# Patient Record
Sex: Female | Born: 1960 | Race: White | Hispanic: No | Marital: Married | Smoking: Never smoker
Health system: Southern US, Community
[De-identification: ages and names within clinical notes are randomized; demographics above are authoritative.]

## PROBLEM LIST (undated history)

## (undated) DIAGNOSIS — I8290 Acute embolism and thrombosis of unspecified vein: Secondary | ICD-10-CM

## (undated) DIAGNOSIS — I10 Essential (primary) hypertension: Secondary | ICD-10-CM

## (undated) DIAGNOSIS — E7800 Pure hypercholesterolemia, unspecified: Secondary | ICD-10-CM

## (undated) DIAGNOSIS — M810 Age-related osteoporosis without current pathological fracture: Secondary | ICD-10-CM

## (undated) HISTORY — PX: OTHER SURGICAL HISTORY: SHX169

## (undated) HISTORY — DX: Age-related osteoporosis without current pathological fracture: M81.0

## (undated) HISTORY — DX: Pure hypercholesterolemia, unspecified: E78.00

## (undated) HISTORY — DX: Essential (primary) hypertension: I10

## (undated) HISTORY — DX: Acute embolism and thrombosis of unspecified vein: I82.90

---

## 1986-01-01 HISTORY — PX: LAPAROSCOPY: SHX197

## 1998-04-19 ENCOUNTER — Other Ambulatory Visit: Admission: RE | Admit: 1998-04-19 | Discharge: 1998-04-19 | Payer: Self-pay | Admitting: Gynecology

## 1999-06-23 ENCOUNTER — Other Ambulatory Visit: Admission: RE | Admit: 1999-06-23 | Discharge: 1999-06-23 | Payer: Self-pay | Admitting: Gynecology

## 2000-10-15 ENCOUNTER — Other Ambulatory Visit: Admission: RE | Admit: 2000-10-15 | Discharge: 2000-10-15 | Payer: Self-pay | Admitting: Gynecology

## 2002-01-06 ENCOUNTER — Other Ambulatory Visit: Admission: RE | Admit: 2002-01-06 | Discharge: 2002-01-06 | Payer: Self-pay | Admitting: Gynecology

## 2003-01-11 ENCOUNTER — Other Ambulatory Visit: Admission: RE | Admit: 2003-01-11 | Discharge: 2003-01-11 | Payer: Self-pay | Admitting: Gynecology

## 2003-07-11 ENCOUNTER — Emergency Department (HOSPITAL_COMMUNITY): Admission: EM | Admit: 2003-07-11 | Discharge: 2003-07-11 | Payer: Self-pay

## 2004-01-13 ENCOUNTER — Other Ambulatory Visit: Admission: RE | Admit: 2004-01-13 | Discharge: 2004-01-13 | Payer: Self-pay | Admitting: Gynecology

## 2005-01-16 ENCOUNTER — Other Ambulatory Visit: Admission: RE | Admit: 2005-01-16 | Discharge: 2005-01-16 | Payer: Self-pay | Admitting: Gynecology

## 2006-01-17 ENCOUNTER — Other Ambulatory Visit: Admission: RE | Admit: 2006-01-17 | Discharge: 2006-01-17 | Payer: Self-pay | Admitting: Gynecology

## 2007-01-22 ENCOUNTER — Other Ambulatory Visit: Admission: RE | Admit: 2007-01-22 | Discharge: 2007-01-22 | Payer: Self-pay | Admitting: Gynecology

## 2007-10-10 ENCOUNTER — Encounter: Admission: RE | Admit: 2007-10-10 | Discharge: 2007-10-10 | Payer: Self-pay | Admitting: Cardiology

## 2007-10-16 ENCOUNTER — Ambulatory Visit: Payer: Self-pay | Admitting: Gynecology

## 2007-12-09 ENCOUNTER — Ambulatory Visit: Payer: Self-pay | Admitting: Gynecology

## 2008-01-27 ENCOUNTER — Ambulatory Visit: Payer: Self-pay | Admitting: Gynecology

## 2008-01-27 ENCOUNTER — Encounter: Payer: Self-pay | Admitting: Gynecology

## 2008-01-27 ENCOUNTER — Other Ambulatory Visit: Admission: RE | Admit: 2008-01-27 | Discharge: 2008-01-27 | Payer: Self-pay | Admitting: Gynecology

## 2008-10-13 ENCOUNTER — Ambulatory Visit: Payer: Self-pay | Admitting: Gynecology

## 2009-07-01 ENCOUNTER — Encounter: Admission: RE | Admit: 2009-07-01 | Discharge: 2009-07-01 | Payer: Self-pay | Admitting: Family Medicine

## 2010-02-21 ENCOUNTER — Encounter: Payer: Self-pay | Admitting: Gynecology

## 2010-02-24 ENCOUNTER — Encounter: Payer: Self-pay | Admitting: Gynecology

## 2010-02-28 ENCOUNTER — Other Ambulatory Visit: Payer: Self-pay | Admitting: Gynecology

## 2010-02-28 ENCOUNTER — Other Ambulatory Visit (HOSPITAL_COMMUNITY)
Admission: RE | Admit: 2010-02-28 | Discharge: 2010-02-28 | Disposition: A | Discharge status: Home / Self Care | Payer: BC Managed Care – PPO | Source: Ambulatory Visit | Attending: Gynecology | Admitting: Gynecology

## 2010-02-28 ENCOUNTER — Encounter (INDEPENDENT_AMBULATORY_CARE_PROVIDER_SITE_OTHER): Payer: BC Managed Care – PPO | Admitting: Gynecology

## 2010-02-28 DIAGNOSIS — Z13220 Encounter for screening for lipoid disorders: Secondary | ICD-10-CM

## 2010-02-28 DIAGNOSIS — Z833 Family history of diabetes mellitus: Secondary | ICD-10-CM

## 2010-02-28 DIAGNOSIS — Z01419 Encounter for gynecological examination (general) (routine) without abnormal findings: Secondary | ICD-10-CM

## 2010-02-28 DIAGNOSIS — R82998 Other abnormal findings in urine: Secondary | ICD-10-CM

## 2010-02-28 DIAGNOSIS — B373 Candidiasis of vulva and vagina: Secondary | ICD-10-CM

## 2010-02-28 DIAGNOSIS — Z124 Encounter for screening for malignant neoplasm of cervix: Secondary | ICD-10-CM | POA: Insufficient documentation

## 2010-04-02 ENCOUNTER — Other Ambulatory Visit: Payer: Self-pay | Admitting: Cardiology

## 2010-04-02 DIAGNOSIS — I10 Essential (primary) hypertension: Secondary | ICD-10-CM

## 2010-05-30 ENCOUNTER — Encounter: Payer: Self-pay | Admitting: Cardiology

## 2010-07-08 ENCOUNTER — Other Ambulatory Visit: Payer: Self-pay | Admitting: Cardiology

## 2010-07-10 NOTE — Telephone Encounter (Signed)
Pt has not been seen in office since 03/2009. Pt is to follow with PCP since Dr Deborah Chalk has retired.

## 2010-07-12 ENCOUNTER — Other Ambulatory Visit: Payer: Self-pay | Admitting: *Deleted

## 2010-07-12 DIAGNOSIS — I10 Essential (primary) hypertension: Secondary | ICD-10-CM

## 2010-07-12 NOTE — Telephone Encounter (Signed)
Fax received from pharmacy. Refill DENIED. Was given 30 and informed to make app or can get from pcp/ tennant pt. Alfonso Ramus RN

## 2010-07-14 ENCOUNTER — Telehealth: Payer: Self-pay | Admitting: Cardiology

## 2010-07-14 NOTE — Telephone Encounter (Signed)
Called needing a refill of Losartan 50mg  [facility] on [address] [phone]. She has been without it for a week. Please call back. I have pulled her chart.

## 2010-07-14 NOTE — Telephone Encounter (Signed)
Informed dr Deborah Chalk retired and needs to get refills through pcp, pt will call pcp. Alfonso Ramus RN

## 2010-11-08 ENCOUNTER — Encounter: Payer: Self-pay | Admitting: Gynecology

## 2010-11-08 DIAGNOSIS — E7800 Pure hypercholesterolemia, unspecified: Secondary | ICD-10-CM | POA: Insufficient documentation

## 2010-11-08 DIAGNOSIS — I10 Essential (primary) hypertension: Secondary | ICD-10-CM | POA: Insufficient documentation

## 2010-11-10 ENCOUNTER — Other Ambulatory Visit: Payer: Self-pay | Admitting: Gynecology

## 2010-11-10 ENCOUNTER — Ambulatory Visit: Payer: BC Managed Care – PPO | Admitting: Gynecology

## 2010-11-10 ENCOUNTER — Encounter: Payer: Self-pay | Admitting: Gynecology

## 2010-11-10 ENCOUNTER — Ambulatory Visit (INDEPENDENT_AMBULATORY_CARE_PROVIDER_SITE_OTHER): Payer: BC Managed Care – PPO

## 2010-11-10 ENCOUNTER — Other Ambulatory Visit: Payer: BC Managed Care – PPO

## 2010-11-10 ENCOUNTER — Ambulatory Visit (INDEPENDENT_AMBULATORY_CARE_PROVIDER_SITE_OTHER): Payer: BC Managed Care – PPO | Admitting: Gynecology

## 2010-11-10 DIAGNOSIS — Z8041 Family history of malignant neoplasm of ovary: Secondary | ICD-10-CM

## 2010-11-10 DIAGNOSIS — Z803 Family history of malignant neoplasm of breast: Secondary | ICD-10-CM

## 2010-11-10 NOTE — Progress Notes (Signed)
Patient presents for ultrasound due to strong family history of ovarian and breast cancer.  She has been alternating ultrasounds with exams every 6 months.  Ultrasound shows normal appearing uterus with endometrial echo 2.3 mm. Right and left ovaries are normal postmenopausal in appearance.   Patient will follow for 6 months she's due for her annual sooner as needed.

## 2011-03-02 ENCOUNTER — Encounter: Payer: BC Managed Care – PPO | Admitting: Gynecology

## 2011-03-06 ENCOUNTER — Encounter: Payer: Self-pay | Admitting: Gynecology

## 2011-03-06 ENCOUNTER — Ambulatory Visit (INDEPENDENT_AMBULATORY_CARE_PROVIDER_SITE_OTHER): Payer: BC Managed Care – PPO | Admitting: Gynecology

## 2011-03-06 VITALS — BP 146/78 | Ht 63.5 in | Wt 128.0 lb

## 2011-03-06 DIAGNOSIS — M899 Disorder of bone, unspecified: Secondary | ICD-10-CM

## 2011-03-06 DIAGNOSIS — Z01419 Encounter for gynecological examination (general) (routine) without abnormal findings: Secondary | ICD-10-CM

## 2011-03-06 DIAGNOSIS — M949 Disorder of cartilage, unspecified: Secondary | ICD-10-CM

## 2011-03-06 DIAGNOSIS — Z131 Encounter for screening for diabetes mellitus: Secondary | ICD-10-CM

## 2011-03-06 DIAGNOSIS — Z8041 Family history of malignant neoplasm of ovary: Secondary | ICD-10-CM

## 2011-03-06 DIAGNOSIS — M8580 Other specified disorders of bone density and structure, unspecified site: Secondary | ICD-10-CM

## 2011-03-06 DIAGNOSIS — Z13220 Encounter for screening for lipoid disorders: Secondary | ICD-10-CM

## 2011-03-06 DIAGNOSIS — R21 Rash and other nonspecific skin eruption: Secondary | ICD-10-CM

## 2011-03-06 LAB — LIPID PANEL
HDL: 101 mg/dL (ref >39)
Total CHOL/HDL Ratio: 2.7 Ratio
VLDL: 32 mg/dL (ref 0–40)

## 2011-03-06 MED ORDER — BETAMETHASONE DIPROPIONATE AUG 0.05 % EX CREA: TOPICAL_CREAM | Freq: Two times a day (BID) | CUTANEOUS | Status: AC

## 2011-03-06 NOTE — Progress Notes (Signed)
Joann Johnson [facility] June 18, 1960 [id number]        51 y.o.  for annual exam.  Overall doing well. Does have a rash she wants me to look at in her right upper chest.  Past medical history,surgical history, medications, allergies, family history and social history were all reviewed and documented in the EPIC chart. ROS:  Was performed and pertinent positives and negatives are included in the history.  Exam: Amy chaperoned present Filed Vitals:   03/06/11 1040  BP: 146/78   General appearance  Normal Skin with eczema type rash right upper anterior chest wall. Head/Neck normal with no cervical or supraclavicular adenopathy thyroid normal Lungs  clear Cardiac RR, without RMG Abdominal  soft, nontender, without masses, organomegaly or hernia Breasts  examined lying and sitting without masses, retractions, discharge or axillary adenopathy. Pelvic  Ext/BUS/vagina  normal   Cervix  normal    Uterus  anteverted, normal size, shape and contour, midline and mobile nontender   Adnexa  Without masses or tenderness    Anus and perineum  normal   Rectovaginal  normal sphincter tone without palpated masses or tenderness.    Assessment/Plan:  51 y.o. female for annual exam.    1. Rash upper anterior right chest wall. Patient is tried various OTC lotions. I prescribed Diprolene 0.05% cream to apply twice a day. Follow up if symptoms persist or worsen. 2. Postmenopausal. Patient doing well off hormones. No bleeding. We'll continue to monitor. 3. Family history of ovarian cancer. We've discussed this on multiple occasions and she wants to be screened. She understands the pitfalls of false-positive false-negative screening. A CA 125 was done today. She'll follow up in the fall at 6 month interval for an ultrasound. Her last ultrasound November 2012 was normal. 4. Pap smear. No Pap smear was done today. Her last Pap smear was 2012. She has no history of abnormal Pap smears with multiple normal reports in her  chart. I discussed less frequent screening her bone she agrees with this we'll plan every 3 years. 5. Mammography. Patients due now and has it scheduled next week. SBE monthly reviewed.  6. Osteopenia. Patient has history of osteopenia with DEXA 04/2006 showing a -1.9 T score at the A-P spine. We'll plan repeat now. Increase calcium vitamin D reviewed. Will check baseline vitamin D today. 7. Colonoscopy. Patients due now and she is to call to arrange this. 8. Health maintenance. She asked about check baseline labs I ordered a CBC glucose lipid profile urinalysis C1 25 and a vitamin D.    Dara Lords MD, 11:05 AM 03/06/2011

## 2011-03-06 NOTE — Patient Instructions (Signed)
Schedule a DEXA bone study. Follow up for ultrasound in 6 months Follow up for annual exam in one year

## 2011-03-07 LAB — CBC WITH DIFFERENTIAL/PLATELET

## 2011-03-07 LAB — URINALYSIS W MICROSCOPIC + REFLEX CULTURE
Bacteria, UA: NONE SEEN
Casts: NONE SEEN
Crystals: NONE SEEN
Ketones, ur: NEGATIVE mg/dL
Nitrite: NEGATIVE
Specific Gravity, Urine: 1.016 (ref 1.005–1.030)
pH: 6.5 (ref 5.0–8.0)

## 2011-03-07 LAB — VITAMIN D 25 HYDROXY (VIT D DEFICIENCY, FRACTURES): Vit D, 25-Hydroxy: 41 ng/mL (ref 30–89)

## 2011-03-12 ENCOUNTER — Encounter: Payer: Self-pay | Admitting: Gynecology

## 2011-03-13 ENCOUNTER — Telehealth: Payer: Self-pay | Admitting: *Deleted

## 2011-03-13 DIAGNOSIS — Z01419 Encounter for gynecological examination (general) (routine) without abnormal findings: Secondary | ICD-10-CM

## 2011-03-13 NOTE — Telephone Encounter (Signed)
Pt was called to apologize that the CBC was not drawn on 3/5 with her other labs. The patient stated she will be back in our office in a month with her mom so therefore she will have the CBC drawn then. Order placed.

## 2011-03-13 NOTE — Telephone Encounter (Signed)
Message copied by Richardson Chiquito on Tue Mar 13, 2011 10:02 AM ------      Message from: Dara Lords      Created: Thu Mar 08, 2011  9:58 PM       okay      ----- Message -----         From: Janus Molder, CMA         Sent: 03/08/2011   3:46 PM           To: Dara Lords, MD            Per Lab this lab was not drawn. Their response was that it was "pulled" with the UA order and so therefore it was missed when drawing the blood orders. The patient will be called to RTO for this test. Sherrilyn Rist      ----- Message -----         From: Dara Lords, MD         Sent: 03/08/2011   3:05 PM           To: Janus Molder, CMA            Sherrilyn Rist,  When I pull up her results I don't see the CBC results?

## 2011-04-04 ENCOUNTER — Ambulatory Visit (INDEPENDENT_AMBULATORY_CARE_PROVIDER_SITE_OTHER): Payer: BC Managed Care – PPO

## 2011-04-04 DIAGNOSIS — M899 Disorder of bone, unspecified: Secondary | ICD-10-CM

## 2011-04-04 DIAGNOSIS — M8580 Other specified disorders of bone density and structure, unspecified site: Secondary | ICD-10-CM

## 2011-04-05 ENCOUNTER — Telehealth: Payer: Self-pay | Admitting: Gynecology

## 2011-04-05 ENCOUNTER — Encounter: Payer: Self-pay | Admitting: Gynecology

## 2011-04-05 NOTE — Telephone Encounter (Signed)
Left message for pt to call.

## 2011-04-05 NOTE — Telephone Encounter (Signed)
Ask patient to make an appointment both for herself and her mother to discuss their bone density reports. They will need 2 separate appointments but I can talk to them both together.

## 2011-04-05 NOTE — Telephone Encounter (Signed)
Pt informed with the below note, she will call her mother to see when a good time would be to come.

## 2012-03-12 ENCOUNTER — Ambulatory Visit (INDEPENDENT_AMBULATORY_CARE_PROVIDER_SITE_OTHER): Payer: BC Managed Care – PPO | Admitting: Gynecology

## 2012-03-12 ENCOUNTER — Encounter: Payer: Self-pay | Admitting: Gynecology

## 2012-03-12 VITALS — BP 120/76 | Ht 64.0 in | Wt 130.0 lb

## 2012-03-12 DIAGNOSIS — Z13220 Encounter for screening for lipoid disorders: Secondary | ICD-10-CM

## 2012-03-12 DIAGNOSIS — M8580 Other specified disorders of bone density and structure, unspecified site: Secondary | ICD-10-CM

## 2012-03-12 DIAGNOSIS — Z01419 Encounter for gynecological examination (general) (routine) without abnormal findings: Secondary | ICD-10-CM

## 2012-03-12 DIAGNOSIS — M899 Disorder of bone, unspecified: Secondary | ICD-10-CM

## 2012-03-12 DIAGNOSIS — M949 Disorder of cartilage, unspecified: Secondary | ICD-10-CM

## 2012-03-12 DIAGNOSIS — Z8041 Family history of malignant neoplasm of ovary: Secondary | ICD-10-CM

## 2012-03-12 NOTE — Patient Instructions (Signed)
Followup for lab work and ultrasound. Otherwise followup in one year for annual exam.

## 2012-03-12 NOTE — Progress Notes (Signed)
Fusae Florio [facility] 10-20-1960 [id number]        52 y.o.  G0P0 for annual exam.  Doing well. Several issues noted below.  Past medical history,surgical history, medications, allergies, family history and social history were all reviewed and documented in the EPIC chart. ROS:  Was performed and pertinent positives and negatives are included in the history.  Exam: Kim assistant Filed Vitals:   03/12/12 1019  BP: 120/76  Height: 5\' 4"  (1.626 m)  Weight: 130 lb (58.968 kg)   General appearance  Normal Skin grossly normal Head/Neck normal with no cervical or supraclavicular adenopathy thyroid normal Lungs  clear Cardiac RR, without RMG Abdominal  soft, nontender, without masses, organomegaly or hernia Breasts  examined lying and sitting without masses, retractions, discharge or axillary adenopathy. Pelvic  Ext/BUS/vagina  normal   Cervix  normal   Uterus  anteverted, normal size, shape and contour, midline and mobile nontender   Adnexa  Without masses or tenderness    Anus and perineum  normal   Rectovaginal  normal sphincter tone without palpated masses or tenderness.    Assessment/Plan:  52 y.o. G0P0 female for annual exam.   1. Postmenopausal. Asymptomatic off HRT. We'll continue to monitor. 2. Osteopenia. DEXA 04/2011 with T score -2.3 FRAX 6.5%/1.1%. Increase calcium vitamin D reviewed. Check vitamin D level. Followup for repeat DEXA next year at 2 year interval. 3. Mammography 03/2011. Patient has scheduled this week. Followup for this. SBE monthly reviewed. 4. Pap smear 2012. No Pap smear done today. No history of abnormal Pap smears. Plan repeat next year a 3 year interval. 5. Colonoscopy due now she knows to scheduled this coming year. 6. Family history of ovarian cancer in 2 maternal aunts, both elderly.  Breast cancer in a maternal cousin age 75. We discussed issues of screening both BRCA1 and 2, genetic counseling and ultrasounds and CA 125 screening. The pitfalls, pros and cons  of doing this have been reviewed to include the false positive and false negative issues. Patient and her mother both declined genetic testing but want ultrasounds and CA 125. We'll plan on arranging. 7. Health maintenance. Baseline CBC comprehensive metabolic panel TSH vitamin D lipid profile CA 125 and urinalysis ordered. She can come back to have the stridor she is not fasting.    Dara Lords MD, 11:06 AM 03/12/2012

## 2012-03-13 LAB — URINALYSIS W MICROSCOPIC + REFLEX CULTURE
Crystals: NONE SEEN
Glucose, UA: NEGATIVE mg/dL
Leukocytes, UA: NEGATIVE
Nitrite: NEGATIVE
Protein, ur: NEGATIVE mg/dL
Specific Gravity, Urine: 1.022 (ref 1.005–1.030)
Squamous Epithelial / LPF: NONE SEEN

## 2012-03-17 ENCOUNTER — Encounter: Payer: Self-pay | Admitting: Gynecology

## 2012-03-18 ENCOUNTER — Other Ambulatory Visit: Payer: Self-pay | Admitting: *Deleted

## 2012-03-18 ENCOUNTER — Other Ambulatory Visit: Payer: Self-pay | Admitting: Gynecology

## 2012-03-18 DIAGNOSIS — R928 Other abnormal and inconclusive findings on diagnostic imaging of breast: Secondary | ICD-10-CM

## 2012-03-18 DIAGNOSIS — N6459 Other signs and symptoms in breast: Secondary | ICD-10-CM

## 2012-03-19 ENCOUNTER — Other Ambulatory Visit: Payer: Self-pay | Admitting: Radiology

## 2012-03-21 ENCOUNTER — Other Ambulatory Visit: Payer: Self-pay

## 2012-03-21 DIAGNOSIS — N6459 Other signs and symptoms in breast: Secondary | ICD-10-CM

## 2012-04-02 ENCOUNTER — Ambulatory Visit (INDEPENDENT_AMBULATORY_CARE_PROVIDER_SITE_OTHER): Payer: BC Managed Care – PPO

## 2012-04-02 ENCOUNTER — Encounter: Payer: Self-pay | Admitting: Gynecology

## 2012-04-02 ENCOUNTER — Ambulatory Visit (INDEPENDENT_AMBULATORY_CARE_PROVIDER_SITE_OTHER): Payer: BC Managed Care – PPO | Admitting: Gynecology

## 2012-04-02 DIAGNOSIS — Z8041 Family history of malignant neoplasm of ovary: Secondary | ICD-10-CM

## 2012-04-02 DIAGNOSIS — Z803 Family history of malignant neoplasm of breast: Secondary | ICD-10-CM

## 2012-04-02 DIAGNOSIS — R102 Pelvic and perineal pain: Secondary | ICD-10-CM

## 2012-04-02 DIAGNOSIS — N83339 Acquired atrophy of ovary and fallopian tube, unspecified side: Secondary | ICD-10-CM

## 2012-04-02 DIAGNOSIS — N949 Unspecified condition associated with female genital organs and menstrual cycle: Secondary | ICD-10-CM

## 2012-04-02 NOTE — Patient Instructions (Signed)
Followup with decision about genetic testing for breast cancer gene

## 2012-04-02 NOTE — Progress Notes (Signed)
Patient presents for ultrasound due to family history of ovarian cancer. She has 2 maternal aunts with ovarian cancer and 3 maternal cousins with breast cancer.  Ultrasound shows small uterus with endometrial echo 1.8 mm. Right and left ovaries atrophic. Cul-de-sac negative.  Assessment and plan: Strong family history of ovarian cancer and breast cancer. Ultrasound is normal. I again discussed options to include BRCA testing. She did not do CA 125 this year because of the false positive/false negative issues she declined to have this done. She did ask me about prophylactic salpingo-oophorectomy. I reviewed in general was involved with laparoscopic something oophorectomy and the risks. I think if she is considering this he certainly should consider BRCA testing. Apparently one of the cousins did have BRCA testing but she is unclear of the results. I offered to draw the BRCA testing today but she declined. I reviewed if she was +80% risk of breast cancer/40% risk of ovarian cancer and what she would do with this to include prophylactic mastectomy, increasing surveillance adding MRI, salpingo-oophorectomy all reviewed.  The patient prefers to Dr. her cousin to find out specifically what the test results were. She does understand that if her cousin tested negative this does not mean that she is not a carrier and that she still could have inherited the gene through her mother. We also discussed about testing her mother but again she declines at this time and said she will check with her cousin to get back to me with her ultimate decision.

## 2012-07-31 ENCOUNTER — Telehealth: Payer: Self-pay | Admitting: *Deleted

## 2012-07-31 NOTE — Telephone Encounter (Signed)
Pt called requesting if you would be willing to give 30 day supply for toprol 25 mg 1po daily & cozaar 50 mg 1 po daily which she takes for blood pressure. Pt said her PCP is out of town, I explained to pt that even if PCP is out of town there should be a back up MD to refill medication for her. Pt then said she is overdue for her annual with Dr.Little. She never had her labs done here from annual in march. Pt asked if you would be willing to fill the above medication along with xanxa 0.5 mg which PCP fills as well. Please advise

## 2012-07-31 NOTE — Telephone Encounter (Signed)
Her primary physician should be able to do all of this for her. Even if they can't see her right away they should be able to give her a limited supply that covers her through her office visit with them. I am not trying to be mean but they really need to be supporting her.

## 2012-07-31 NOTE — Telephone Encounter (Signed)
Pt informed with the below note. 

## 2012-09-23 ENCOUNTER — Other Ambulatory Visit: Payer: Self-pay | Admitting: Gastroenterology

## 2012-10-15 ENCOUNTER — Encounter: Payer: Self-pay | Admitting: Gynecology

## 2013-03-17 ENCOUNTER — Encounter: Payer: Self-pay | Admitting: Gynecology

## 2013-03-17 ENCOUNTER — Ambulatory Visit (INDEPENDENT_AMBULATORY_CARE_PROVIDER_SITE_OTHER): Payer: BC Managed Care – PPO | Admitting: Gynecology

## 2013-03-17 ENCOUNTER — Other Ambulatory Visit (HOSPITAL_COMMUNITY)
Admission: RE | Admit: 2013-03-17 | Discharge: 2013-03-17 | Disposition: A | Discharge status: Home / Self Care | Payer: BC Managed Care – PPO | Source: Ambulatory Visit | Attending: Gynecology | Admitting: Gynecology

## 2013-03-17 VITALS — BP 120/66 | Ht 62.5 in | Wt 136.0 lb

## 2013-03-17 DIAGNOSIS — M8580 Other specified disorders of bone density and structure, unspecified site: Secondary | ICD-10-CM

## 2013-03-17 DIAGNOSIS — Z01419 Encounter for gynecological examination (general) (routine) without abnormal findings: Secondary | ICD-10-CM

## 2013-03-17 DIAGNOSIS — Z8041 Family history of malignant neoplasm of ovary: Secondary | ICD-10-CM

## 2013-03-17 DIAGNOSIS — M899 Disorder of bone, unspecified: Secondary | ICD-10-CM

## 2013-03-17 DIAGNOSIS — Z1151 Encounter for screening for human papillomavirus (HPV): Secondary | ICD-10-CM | POA: Insufficient documentation

## 2013-03-17 DIAGNOSIS — M949 Disorder of cartilage, unspecified: Secondary | ICD-10-CM

## 2013-03-17 NOTE — Progress Notes (Signed)
Nancylee Gaines [facility] April 07, 1960 [id number]        53 y.o.  G0P0 for annual exam.  Several issues noted below.  Past medical history,surgical history, problem list, medications, allergies, family history and social history were all reviewed and documented in the EPIC chart.  ROS:  Performed and pertinent positives and negatives are included in the history, assessment and plan .  Exam: Kim assistant Filed Vitals:   03/17/13 1014  BP: 120/66  Height: 5' 2.5" (1.588 m)  Weight: 136 lb (61.689 kg)   General appearance  Normal Skin grossly normal Head/Neck normal with no cervical or supraclavicular adenopathy thyroid normal Lungs  clear Cardiac RR, without RMG Abdominal  soft, nontender, without masses, organomegaly or hernia Breasts  examined lying and sitting without masses, retractions, discharge or axillary adenopathy. Pelvic  Ext/BUS/vagina general atrophic changes.  Cervix normal. Pap/HPV  Uterus anteverted, normal size, shape and contour, midline and mobile nontender   Adnexa  Without masses or tenderness    Anus and perineum  Normal   Rectovaginal  Normal sphincter tone without palpated masses or tenderness.    Assessment/Plan:  53 y.o. G0P0 female for annual exam.   1. Postmenopausal. Patient without significant symptoms of hot flushes night sweats vaginal dryness or dyspareunia. No vaginal bleeding. Plan expectant management. Report any vaginal bleeding. 2. Osteopenia. DEXA 04/2011 T score -2.3 FRAX 6.5%/1.1%. Repeat DEXA now a 2 year interval. Increase calcium vitamin D reviewed. Check vitamin D level. 3. Family history of ovarian cancer in 2 elderly maternal aunts. Breast cancer in maternal cousin. Cousin was tested for BRCA gene and negative. Patient understands is not guaranteed she is not a carrier but has declined testing. Does want ultrasound/CA 125 screening. Will followup for this. We've also discussed in the past prophylactic surgeries at this point declines. 4. Pap smear  2012. Repeat Pap smear/HPV today. No history of abnormal Pap smears previously. Assuming normal then repeat in 3-5 year interval. 5. Mammography due now patient has scheduled and will followup for this. SBE monthly reviewed. 6. Colonoscopy 2014. Repeat at their recommended interval. 7. Health maintenance. Patient is followed by her primary physician for hypertension/hypercholesterolemia but requests baseline lab work here. Is not fasting and will return for a baseline CBC comprehensive metabolic panel TSH vitamin D lipid profile CA 125. Followup for ultrasound and bone density as scheduled.   Note: This document was prepared with digital dictation and possible smart phrase technology. Any transcriptional errors that result from this process are unintentional.   Anastasio Auerbach MD, 11:21 AM 03/17/2013

## 2013-03-17 NOTE — Patient Instructions (Signed)
Followup for bone density and ultrasound as scheduled.

## 2013-03-18 LAB — URINALYSIS W MICROSCOPIC + REFLEX CULTURE
Bacteria, UA: NONE SEEN
Bilirubin Urine: NEGATIVE
CASTS: NONE SEEN
Crystals: NONE SEEN
GLUCOSE, UA: NEGATIVE mg/dL
HGB URINE DIPSTICK: NEGATIVE
Ketones, ur: NEGATIVE mg/dL
Nitrite: NEGATIVE
PH: 7 (ref 5.0–8.0)
Protein, ur: NEGATIVE mg/dL
SQUAMOUS EPITHELIAL / LPF: NONE SEEN
Specific Gravity, Urine: 1.018 (ref 1.005–1.030)
Urobilinogen, UA: 0.2 mg/dL (ref 0.0–1.0)

## 2013-03-19 LAB — URINE CULTURE

## 2013-04-22 ENCOUNTER — Other Ambulatory Visit: Payer: Self-pay | Admitting: Gynecology

## 2013-04-22 ENCOUNTER — Ambulatory Visit (INDEPENDENT_AMBULATORY_CARE_PROVIDER_SITE_OTHER): Payer: BC Managed Care – PPO | Admitting: Gynecology

## 2013-04-22 ENCOUNTER — Ambulatory Visit (INDEPENDENT_AMBULATORY_CARE_PROVIDER_SITE_OTHER): Payer: BC Managed Care – PPO

## 2013-04-22 ENCOUNTER — Encounter: Payer: Self-pay | Admitting: Gynecology

## 2013-04-22 DIAGNOSIS — Z8041 Family history of malignant neoplasm of ovary: Secondary | ICD-10-CM

## 2013-04-22 DIAGNOSIS — N83339 Acquired atrophy of ovary and fallopian tube, unspecified side: Secondary | ICD-10-CM

## 2013-04-22 DIAGNOSIS — R102 Pelvic and perineal pain: Secondary | ICD-10-CM

## 2013-04-22 DIAGNOSIS — N949 Unspecified condition associated with female genital organs and menstrual cycle: Secondary | ICD-10-CM

## 2013-04-22 NOTE — Progress Notes (Signed)
Joann Johnson [facility] 1960-04-24 [id number]        53 y.o.  G0P0 presents for ultrasound. Family history of ovarian cancer in 2 maternal aunts. Breast cancer in maternal cousin. Cousin tested for BRCA gene and was negative. Patient elected for ultrasound/CA 125 screening.  Past medical history,surgical history, problem list, medications, allergies, family history and social history were all reviewed and documented in the EPIC chart.  Directed ROS with pertinent positives and negatives documented in the history of present illness/assessment and plan.  Ultrasound shows uterus normal size and echotexture. Endometrial echo 1.5 mm. Right and left ovaries visualized and atrophic. Cul-de-sac negative.  Assessment/Plan:  53 y.o. G0P0 with strong family history of ovarian cancer and breast cancer. Ultrasound is negative. She's coming back X. week to have blood work drawn to include her CA 125.   Note: This document was prepared with digital dictation and possible smart phrase technology. Any transcriptional errors that result from this process are unintentional.   Anastasio Auerbach MD, 11:26 AM 04/22/2013

## 2013-04-22 NOTE — Patient Instructions (Signed)
Followup to have your blood drawn.

## 2013-04-28 ENCOUNTER — Ambulatory Visit (INDEPENDENT_AMBULATORY_CARE_PROVIDER_SITE_OTHER): Payer: BC Managed Care – PPO

## 2013-04-28 ENCOUNTER — Other Ambulatory Visit: Payer: Self-pay | Admitting: Gynecology

## 2013-04-28 DIAGNOSIS — M8580 Other specified disorders of bone density and structure, unspecified site: Secondary | ICD-10-CM

## 2013-04-28 DIAGNOSIS — M810 Age-related osteoporosis without current pathological fracture: Secondary | ICD-10-CM

## 2013-05-01 DIAGNOSIS — M810 Age-related osteoporosis without current pathological fracture: Secondary | ICD-10-CM

## 2013-05-01 HISTORY — DX: Age-related osteoporosis without current pathological fracture: M81.0

## 2013-05-05 ENCOUNTER — Telehealth: Payer: Self-pay | Admitting: Gynecology

## 2013-05-05 ENCOUNTER — Encounter: Payer: Self-pay | Admitting: Gynecology

## 2013-05-05 NOTE — Telephone Encounter (Signed)
Left message for pt to call.

## 2013-05-05 NOTE — Telephone Encounter (Signed)
Tell patient her bone density shows osteoporosis.  Recommend office visit to discuss treatment options. 

## 2013-05-18 NOTE — Telephone Encounter (Signed)
Left message for pt to call.

## 2013-05-26 NOTE — Telephone Encounter (Signed)
Pt informed with the below note, transferred to front desk.  

## 2013-06-15 ENCOUNTER — Ambulatory Visit: Payer: BC Managed Care – PPO | Admitting: Gynecology

## 2013-06-17 ENCOUNTER — Encounter: Payer: Self-pay | Admitting: Gynecology

## 2013-06-30 ENCOUNTER — Encounter: Payer: Self-pay | Admitting: Gynecology

## 2013-06-30 ENCOUNTER — Other Ambulatory Visit: Payer: Self-pay | Admitting: Gynecology

## 2013-06-30 ENCOUNTER — Ambulatory Visit (INDEPENDENT_AMBULATORY_CARE_PROVIDER_SITE_OTHER): Payer: BC Managed Care – PPO | Admitting: Gynecology

## 2013-06-30 DIAGNOSIS — M810 Age-related osteoporosis without current pathological fracture: Secondary | ICD-10-CM

## 2013-06-30 LAB — COMPREHENSIVE METABOLIC PANEL
ALBUMIN: 4.4 g/dL (ref 3.5–5.2)
ALT: 34 U/L (ref 0–35)
AST: 28 U/L (ref 0–37)
Alkaline Phosphatase: 77 U/L (ref 39–117)
BUN: 17 mg/dL (ref 6–23)
CALCIUM: 9.5 mg/dL (ref 8.4–10.5)
CHLORIDE: 99 meq/L (ref 96–112)
CO2: 26 mEq/L (ref 19–32)
Creat: 0.61 mg/dL (ref 0.50–1.10)
Glucose, Bld: 94 mg/dL (ref 70–99)
POTASSIUM: 4.7 meq/L (ref 3.5–5.3)
Sodium: 137 mEq/L (ref 135–145)
Total Bilirubin: 0.4 mg/dL (ref 0.2–1.2)
Total Protein: 7 g/dL (ref 6.0–8.3)

## 2013-06-30 LAB — TSH: TSH: 1.574 u[IU]/mL (ref 0.350–4.500)

## 2013-06-30 MED ORDER — RISEDRONATE SODIUM 150 MG PO TABS: 150.0000 mg | ORAL_TABLET | ORAL | Status: DC

## 2013-06-30 MED ORDER — ALPRAZOLAM 0.5 MG PO TABS: 0.5000 mg | ORAL_TABLET | Freq: Every evening | ORAL | Status: DC | PRN

## 2013-06-30 NOTE — Patient Instructions (Signed)
Followup with your 24 hour urine collection. Start on Actonel. Let me know if you have any issues with this.  Osteoporosis Throughout your life, your body breaks down old bone and replaces it with new bone. As you get older, your body does not replace bone as quickly as it breaks it down. By the age of 30 years, most people begin to gradually lose bone because of the imbalance between bone loss and replacement. Some people lose more bone than others. Bone loss beyond a specified normal degree is considered osteoporosis.  Osteoporosis affects the strength and durability of your bones. The inside of the ends of your bones and your flat bones, like the bones of your pelvis, look like honeycomb, filled with tiny open spaces. As bone loss occurs, your bones become less dense. This means that the open spaces inside your bones become bigger and the walls between these spaces become thinner. This makes your bones weaker. Bones of a person with osteoporosis can become so weak that they can break (fracture) during minor accidents, such as a simple fall. CAUSES  The following factors have been associated with the development of osteoporosis:  Smoking.  Drinking more than 2 alcoholic drinks several days per week.  Long-term use of certain medicines:  Corticosteroids.  Chemotherapy medicines.  Thyroid medicines.  Antiepileptic medicines.  Gonadal hormone suppression medicine.  Immunosuppression medicine.  Being underweight.  Lack of physical activity.  Lack of exposure to the sun. This can lead to vitamin D deficiency.  Certain medical conditions:  Certain inflammatory bowel diseases, such as Crohn disease and ulcerative colitis.  Diabetes.  Hyperthyroidism.  Hyperparathyroidism. RISK FACTORS Anyone can develop osteoporosis. However, the following factors can increase your risk of developing osteoporosis:  Gender--Women are at higher risk than men.  Age--Being older than 50 years  increases your risk.  Ethnicity--White and Asian people have an increased risk.  Weight --Being extremely underweight can increase your risk of osteoporosis.  Family history of osteoporosis--Having a family member who has developed osteoporosis can increase your risk. SYMPTOMS  Usually, people with osteoporosis have no symptoms.  DIAGNOSIS  Signs during a physical exam that may prompt your caregiver to suspect osteoporosis include:  Decreased height. This is usually caused by the compression of the bones that form your spine (vertebrae) because they have weakened and become fractured.  A curving or rounding of the upper back (kyphosis). To confirm signs of osteoporosis, your caregiver may request a procedure that uses 2 low-dose X-ray beams with different levels of energy to measure your bone mineral density (dual-energy X-ray absorptiometry [DXA]). Also, your caregiver may check your level of vitamin D. TREATMENT  The goal of osteoporosis treatment is to strengthen bones in order to decrease the risk of bone fractures. There are different types of medicines available to help achieve this goal. Some of these medicines work by slowing the processes of bone loss. Some medicines work by increasing bone density. Treatment also involves making sure that your levels of calcium and vitamin D are adequate. PREVENTION  There are things you can do to help prevent osteoporosis. Adequate intake of calcium and vitamin D can help you achieve optimal bone mineral density. Regular exercise can also help, especially resistance and weight-bearing activities. If you smoke, quitting smoking is an important part of osteoporosis prevention. MAKE SURE YOU:  Understand these instructions.  Will watch your condition.  Will get help right away if you are not doing well or get worse. FOR MORE INFORMATION www.osteo.org  and [employer].cawww.nof.org Document Released: 09/27/2004 Document Revised: 04/14/2012 Document Reviewed:  12/02/2010 [facility] Patient Information 2015 [place], [place]. This information is not intended to replace advice given to you by your health care provider. Make sure you discuss any questions you have with your health care provider.

## 2013-06-30 NOTE — Progress Notes (Signed)
[name] October 17, 1960 [id number]        53 y.o.  G0P0 presents to discuss her most recent bone density which shows osteoporosis T score -2.9.  Past medical history,surgical history, problem list, medications, allergies, family history and social history were all reviewed and documented in the EPIC chart.  Directed ROS with pertinent positives and negatives documented in the history of present illness/assessment and plan.  Assessment/Plan:  53 y.o. G0P0 with osteoporosis. Transiently tried Fosamax/Actonel the past and reported some GI upset. I reviewed the recommendation for treatment and options to include retry of bisphosphate versus Prolia. Reviewed risks to include osteonecrosis of the jaw, atypical fractures, esophagitis/GERD. Patient wants to retry Actonel 150 mg monthly. We'll go ahead and start this and see how she does and tentatively plan on repeat DEXA at 2 years assuming she does well with the Actonel. Check baseline labs to include comprehensive metabolic panel, TSH, PTH, vitamin D and 24-hour urine calcium.   Note: This document was prepared with digital dictation and possible smart phrase technology. Any transcriptional errors that result from this process are unintentional.   Dara LordsFONTAINE,Joann P MD, 10:12 AM 06/30/2013

## 2013-07-01 LAB — PARATHYROID HORMONE, INTACT (NO CA): PTH: 39.4 pg/mL (ref 14.0–72.0)

## 2013-07-01 LAB — VITAMIN D 25 HYDROXY (VIT D DEFICIENCY, FRACTURES): Vit D, 25-Hydroxy: 44 ng/mL (ref 30–89)

## 2014-06-07 ENCOUNTER — Encounter: Payer: Self-pay | Admitting: Gynecology

## 2014-06-07 ENCOUNTER — Ambulatory Visit (INDEPENDENT_AMBULATORY_CARE_PROVIDER_SITE_OTHER): Payer: BLUE CROSS/BLUE SHIELD | Admitting: Gynecology

## 2014-06-07 VITALS — BP 119/78 | Ht 64.0 in | Wt 143.0 lb

## 2014-06-07 DIAGNOSIS — N952 Postmenopausal atrophic vaginitis: Secondary | ICD-10-CM

## 2014-06-07 DIAGNOSIS — Z01419 Encounter for gynecological examination (general) (routine) without abnormal findings: Secondary | ICD-10-CM

## 2014-06-07 DIAGNOSIS — M810 Age-related osteoporosis without current pathological fracture: Secondary | ICD-10-CM | POA: Diagnosis not present

## 2014-06-07 NOTE — Progress Notes (Signed)
Joann Johnson [facility] Apr 29, 1960 [id number]        54 y.o.  G0P0 for annual exam.  Several issues noted below.  Past medical history,surgical history, problem list, medications, allergies, family history and social history were all reviewed and documented as reviewed in the EPIC chart.  ROS:  Performed with pertinent positives and negatives included in the history, assessment and plan.   Additional significant findings :  none   Exam: Administrator, [employer] Vitals:   06/07/14 0935  BP: 119/78  Height: 5' 4"  (1.626 m)  Weight: 143 lb (64.864 kg)   General appearance:  Normal affect, orientation and appearance. Skin: Grossly normal HEENT: Without gross lesions.  No cervical or supraclavicular adenopathy. Thyroid normal.  Lungs:  Clear without wheezing, rales or rhonchi Cardiac: RR, without RMG Abdominal:  Soft, nontender, without masses, guarding, rebound, organomegaly or hernia Breasts:  Examined lying and sitting without masses, retractions, discharge or axillary adenopathy. Pelvic:  Ext/BUS/vagina with atrophic changes  Cervix with atrophic changes  Uterus anteverted, normal size, shape and contour, midline and mobile nontender   Adnexa  Without masses or tenderness    Anus and perineum  Normal   Rectovaginal  Normal sphincter tone without palpated masses or tenderness.    Assessment/Plan:  54 y.o. G0P0 female for annual exam.   1. Postmenopausal. Without significant hot flushes, night sweats, vaginal dryness or dyspareunia. No vaginal bleeding. Continue to monitor. Report any vaginal bleeding. 2. Osteoporosis. DEXA 05/2013 T score -2.9. Tried Fosamax with GI upset. Was prescribed Actonel but did not take it due to the cost. Secondary workup negative with the exception she did not do the 24-hour urine calcium. Patient agrees to do this now. Again rediscussed fracture risk/treatment options. We will go ahead with Prolia twice yearly. We have discussed the risks to include osteonecrosis  of jaw, atypical fractures rashes and infections. 3. Family history of ovarian cancer. 2 elderly maternal aunts with ovarian cancer and a maternal cousin with breast cancer. Cousin was tested and negative for BRCA gene. We have discussed this on multiple occasions and patient has declined genetic testing. She does do surveillance ultrasound and CA-125 screening. We'll schedule ultrasound now and have her CA-125 checked. 4. Pap smear/HPV negative 22015. No Pap smear done today. No history of significant abnormal Pap smears previously. 5. Mammography 06/2013. Patient is due now and will schedule this. SBE monthly reviewed. 6. Colonoscopy 2014. Repeat at their recommended interval. 7. Health maintenance. Patient's going to return for fasting CBC, comprehensive metabolic panel, lipid profile, urinalysis, vitamin D, TSH 24-hour urine calcium excretion and CA-125.     Anastasio Auerbach MD, 10:00 AM 06/07/2014

## 2014-06-07 NOTE — Patient Instructions (Signed)
Follow up for ultrasound as scheduled.  Office will check about the Prolia.  Call if you do not hear from the office in 2 weeks.  You may obtain a copy of any labs that were done today by logging onto MyChart as outlined in the instructions provided with your AVS (after visit summary). The office will not call with normal lab results but certainly if there are any significant abnormalities then we will contact you.   Health Maintenance, Female A healthy lifestyle and preventative care can promote health and wellness.  Maintain regular health, dental, and eye exams.  Eat a healthy diet. Foods like vegetables, fruits, whole grains, low-fat dairy products, and lean protein foods contain the nutrients you need without too many calories. Decrease your intake of foods high in solid fats, added sugars, and salt. Get information about a proper diet from your caregiver, if necessary.  Regular physical exercise is one of the most important things you can do for your health. Most adults should get at least 150 minutes of moderate-intensity exercise (any activity that increases your heart rate and causes you to sweat) each week. In addition, most adults need muscle-strengthening exercises on 2 or more days a week.   Maintain a healthy weight. The body mass index (BMI) is a screening tool to identify possible weight problems. It provides an estimate of body fat based on height and weight. Your caregiver can help determine your BMI, and can help you achieve or maintain a healthy weight. For adults 20 years and older:  A BMI below 18.5 is considered underweight.  A BMI of 18.5 to 24.9 is normal.  A BMI of 25 to 29.9 is considered overweight.  A BMI of 30 and above is considered obese.  Maintain normal blood lipids and cholesterol by exercising and minimizing your intake of saturated fat. Eat a balanced diet with plenty of fruits and vegetables. Blood tests for lipids and cholesterol should begin at age 69  and be repeated every 5 years. If your lipid or cholesterol levels are high, you are over 50, or you are a high risk for heart disease, you may need your cholesterol levels checked more frequently.Ongoing high lipid and cholesterol levels should be treated with medicines if diet and exercise are not effective.  If you smoke, find out from your caregiver how to quit. If you do not use tobacco, do not start.  Lung cancer screening is recommended for adults aged 45 80 years who are at high risk for developing lung cancer because of a history of smoking. Yearly low-dose computed tomography (CT) is recommended for people who have at least a 30-pack-year history of smoking and are a current smoker or have quit within the past 15 years. A pack year of smoking is smoking an average of 1 pack of cigarettes a day for 1 year (for example: 1 pack a day for 30 years or 2 packs a day for 15 years). Yearly screening should continue until the smoker has stopped smoking for at least 15 years. Yearly screening should also be stopped for people who develop a health problem that would prevent them from having lung cancer treatment.  If you are pregnant, do not drink alcohol. If you are breastfeeding, be very cautious about drinking alcohol. If you are not pregnant and choose to drink alcohol, do not exceed 1 drink per day. One drink is considered to be 12 ounces (355 mL) of beer, 5 ounces (148 mL) of wine, or 1.5 ounces (  44 mL) of liquor.  Avoid use of street drugs. Do not share needles with anyone. Ask for help if you need support or instructions about stopping the use of drugs.  High blood pressure causes heart disease and increases the risk of stroke. Blood pressure should be checked at least every 1 to 2 years. Ongoing high blood pressure should be treated with medicines, if weight loss and exercise are not effective.  If you are 86 to 56 years old, ask your caregiver if you should take aspirin to prevent  strokes.  Diabetes screening involves taking a blood sample to check your fasting blood sugar level. This should be done once every 3 years, after age 57, if you are within normal weight and without risk factors for diabetes. Testing should be considered at a younger age or be carried out more frequently if you are overweight and have at least 1 risk factor for diabetes.  Breast cancer screening is essential preventative care for women. You should practice "breast self-awareness." This means understanding the normal appearance and feel of your breasts and may include breast self-examination. Any changes detected, no matter how small, should be reported to a caregiver. Women in their 18s and 30s should have a clinical breast exam (CBE) by a caregiver as part of a regular health exam every 1 to 3 years. After age 69, women should have a CBE every year. Starting at age 80, women should consider having a mammogram (breast X-ray) every year. Women who have a family history of breast cancer should talk to their caregiver about genetic screening. Women at a high risk of breast cancer should talk to their caregiver about having an MRI and a mammogram every year.  Breast cancer gene (BRCA)-related cancer risk assessment is recommended for women who have family members with BRCA-related cancers. BRCA-related cancers include breast, ovarian, tubal, and peritoneal cancers. Having family members with these cancers may be associated with an increased risk for harmful changes (mutations) in the breast cancer genes BRCA1 and BRCA2. Results of the assessment will determine the need for genetic counseling and BRCA1 and BRCA2 testing.  The Pap test is a screening test for cervical cancer. Women should have a Pap test starting at age 37. Between ages 20 and 18, Pap tests should be repeated every 2 years. Beginning at age 78, you should have a Pap test every 3 years as long as the past 3 Pap tests have been normal. If you had a  hysterectomy for a problem that was not cancer or a condition that could lead to cancer, then you no longer need Pap tests. If you are between ages 81 and 40, and you have had normal Pap tests going back 10 years, you no longer need Pap tests. If you have had past treatment for cervical cancer or a condition that could lead to cancer, you need Pap tests and screening for cancer for at least 20 years after your treatment. If Pap tests have been discontinued, risk factors (such as a new sexual partner) need to be reassessed to determine if screening should be resumed. Some women have medical problems that increase the chance of getting cervical cancer. In these cases, your caregiver may recommend more frequent screening and Pap tests.  The human papillomavirus (HPV) test is an additional test that may be used for cervical cancer screening. The HPV test looks for the virus that can cause the cell changes on the cervix. The cells collected during the Pap test can  be tested for HPV. The HPV test could be used to screen women aged 81 years and older, and should be used in women of any age who have unclear Pap test results. After the age of 79, women should have HPV testing at the same frequency as a Pap test.  Colorectal cancer can be detected and often prevented. Most routine colorectal cancer screening begins at the age of 28 and continues through age 26. However, your caregiver may recommend screening at an earlier age if you have risk factors for colon cancer. On a yearly basis, your caregiver may provide home test kits to check for hidden blood in the stool. Use of a small camera at the end of a tube, to directly examine the colon (sigmoidoscopy or colonoscopy), can detect the earliest forms of colorectal cancer. Talk to your caregiver about this at age 16, when routine screening begins. Direct examination of the colon should be repeated every 5 to 10 years through age 67, unless early forms of pre-cancerous  polyps or small growths are found.  Hepatitis C blood testing is recommended for all people born from [birth year] through 1967 and any individual with known risks for hepatitis C.  Practice safe sex. Use condoms and avoid high-risk sexual practices to reduce the spread of sexually transmitted infections (STIs). Sexually active women aged 30 and younger should be checked for Chlamydia, which is a common sexually transmitted infection. Older women with new or multiple partners should also be tested for Chlamydia. Testing for other STIs is recommended if you are sexually active and at increased risk.  Osteoporosis is a disease in which the bones lose minerals and strength with aging. This can result in serious bone fractures. The risk of osteoporosis can be identified using a bone density scan. Women ages 32 and over and women at risk for fractures or osteoporosis should discuss screening with their caregivers. Ask your caregiver whether you should be taking a calcium supplement or vitamin D to reduce the rate of osteoporosis.  Menopause can be associated with physical symptoms and risks. Hormone replacement therapy is available to decrease symptoms and risks. You should talk to your caregiver about whether hormone replacement therapy is right for you.  Use sunscreen. Apply sunscreen liberally and repeatedly throughout the day. You should seek shade when your shadow is shorter than you. Protect yourself by wearing long sleeves, pants, a wide-brimmed hat, and sunglasses year round, whenever you are outdoors.  Notify your caregiver of new moles or changes in moles, especially if there is a change in shape or color. Also notify your caregiver if a mole is larger than the size of a pencil eraser.  Stay current with your immunizations. Document Released: 07/03/2010 Document Revised: 04/14/2012 Document Reviewed: 07/03/2010 [facility] Patient Information 2014 [place].

## 2014-06-08 ENCOUNTER — Telehealth: Payer: Self-pay | Admitting: Gynecology

## 2014-06-08 LAB — URINALYSIS W MICROSCOPIC + REFLEX CULTURE
BILIRUBIN URINE: NEGATIVE
Bacteria, UA: NONE SEEN
CRYSTALS: NONE SEEN
Casts: NONE SEEN
Glucose, UA: NEGATIVE mg/dL
HGB URINE DIPSTICK: NEGATIVE
KETONES UR: NEGATIVE mg/dL
Leukocytes, UA: NEGATIVE
Nitrite: NEGATIVE
Protein, ur: NEGATIVE mg/dL
Specific Gravity, Urine: 1.019 (ref 1.005–1.030)
Squamous Epithelial / LPF: NONE SEEN
Urobilinogen, UA: 0.2 mg/dL (ref 0.0–1.0)
pH: 6 (ref 5.0–8.0)

## 2014-06-08 NOTE — Telephone Encounter (Addendum)
I want to go ahead and start this patient on Prolia. Had attempted Fosamax with GI upset. Osteoporosis with T score -2.9 (note from Dr Audie BoxFontaine).    Prolia insurance benefit information sent to Prolia. Phone call to pt , left message that I will call her after we receive benefits.

## 2014-06-15 NOTE — Telephone Encounter (Signed)
Phone call to pt left message, Benefits require PA. Coverage for Prolia is No deductible, Co-pay $50 with or without office visit. Co Pay contribute to OOP MAX $1500 ($0) met. Asked pt to call me to confirm information left on voice mail.

## 2014-06-23 ENCOUNTER — Ambulatory Visit (INDEPENDENT_AMBULATORY_CARE_PROVIDER_SITE_OTHER): Payer: BLUE CROSS/BLUE SHIELD | Admitting: Gynecology

## 2014-06-23 ENCOUNTER — Ambulatory Visit (INDEPENDENT_AMBULATORY_CARE_PROVIDER_SITE_OTHER): Payer: BLUE CROSS/BLUE SHIELD

## 2014-06-23 ENCOUNTER — Other Ambulatory Visit: Payer: Self-pay | Admitting: Gynecology

## 2014-06-23 ENCOUNTER — Encounter: Payer: Self-pay | Admitting: Gynecology

## 2014-06-23 VITALS — BP 124/78

## 2014-06-23 DIAGNOSIS — R1032 Left lower quadrant pain: Secondary | ICD-10-CM

## 2014-06-23 DIAGNOSIS — Z01419 Encounter for gynecological examination (general) (routine) without abnormal findings: Secondary | ICD-10-CM

## 2014-06-23 DIAGNOSIS — Z8041 Family history of malignant neoplasm of ovary: Secondary | ICD-10-CM

## 2014-06-23 MED ORDER — ALPRAZOLAM 0.5 MG PO TABS: 0.5000 mg | ORAL_TABLET | Freq: Every evening | ORAL | Status: DC | PRN

## 2014-06-23 NOTE — Progress Notes (Signed)
Iyiana Capeles [facility] 10-30-1960 [id number]        54 y.o.  G0P0 Presents for ultrasound due to her family history of ovarian cancer. Has not done her fasting blood work to include CA-125 yet but is going to do this next week.  Past medical history,surgical history, problem list, medications, allergies, family history and social history were all reviewed and documented in the EPIC chart.  Directed ROS with pertinent positives and negatives documented in the history of present illness/assessment and plan.  Exam: Filed Vitals:   06/23/14 0904  BP: 124/78   General appearance:  Normal Ultrasound shows uterus normal size and echotexture. Endometrial echo 2.3 mm.  Right and left ovaries visualized and atrophic. Cul-de-sac negative.  Assessment/Plan:  54 y.o. G0P0 with history of ovarian cancer in the family. Negative ultrasound. Follow up for fasting blood work to include her CA-125. Otherwise follow up in one year for annual exam.    Dara Lords MD, 9:10 AM 06/23/2014

## 2014-06-23 NOTE — Patient Instructions (Signed)
Follow up for your fasting blood work. 

## 2014-06-24 NOTE — Telephone Encounter (Addendum)
Prior Auth received from [employer], [id number] reference , dates 06/23/2014 thru 06/23/2015 Approval  ( 2 injections per year ) new information from [employer](120.0 units) total authorized.  Joann Johnson will read information about the Prolia and call with her decision.

## 2014-06-29 ENCOUNTER — Encounter: Payer: Self-pay | Admitting: Gynecology

## 2014-07-06 NOTE — Telephone Encounter (Signed)
Left VM requesting patient to call regarding her decision about Prolia injection.

## 2014-07-08 NOTE — Telephone Encounter (Signed)
Phone call from pt. She is going to Dentist prior to Prolia injections for check up and to receive advise regarding the Prolia injections. She will call with her decision.

## 2014-07-27 NOTE — Telephone Encounter (Signed)
Phone message from patient. She has decided that at this time she does not want to start Prolia injections due to some dental issues that she is in the process with. She would like to wait and call back  after all dental procedures have been completed.She does understand that her PA approval is dated 06/23/14 thru 06/23/2015.  She will need repeat Calcium level prior to Prolia injections , last calcium 9.5 on 06/30/2013

## 2014-08-17 ENCOUNTER — Encounter: Payer: Self-pay | Admitting: Gynecology

## 2014-08-19 ENCOUNTER — Other Ambulatory Visit: Payer: Self-pay | Admitting: Radiology

## 2014-08-23 ENCOUNTER — Encounter: Payer: Self-pay | Admitting: Gynecology

## 2014-12-08 ENCOUNTER — Other Ambulatory Visit: Payer: Self-pay | Admitting: Otolaryngology

## 2014-12-08 DIAGNOSIS — H9311 Tinnitus, right ear: Secondary | ICD-10-CM

## 2014-12-15 ENCOUNTER — Ambulatory Visit
Admission: RE | Admit: 2014-12-15 | Discharge: 2014-12-15 | Disposition: A | Discharge status: Home / Self Care | Payer: BLUE CROSS/BLUE SHIELD | Source: Ambulatory Visit | Attending: Otolaryngology | Admitting: Otolaryngology

## 2014-12-15 DIAGNOSIS — H9311 Tinnitus, right ear: Secondary | ICD-10-CM

## 2014-12-15 MED ORDER — GADOBENATE DIMEGLUMINE 529 MG/ML IV SOLN
10.0000 mL | Freq: Once | INTRAVENOUS | Status: AC | PRN
  Administered 2014-12-15: 10 mL via INTRAVENOUS

## 2015-02-15 ENCOUNTER — Other Ambulatory Visit: Payer: Self-pay | Admitting: Gynecology

## 2015-02-15 NOTE — Telephone Encounter (Signed)
Called into pharmacy

## 2015-04-21 ENCOUNTER — Other Ambulatory Visit: Payer: Self-pay | Admitting: Gynecology

## 2015-04-21 DIAGNOSIS — N649 Disorder of breast, unspecified: Secondary | ICD-10-CM

## 2015-04-21 DIAGNOSIS — N6489 Other specified disorders of breast: Secondary | ICD-10-CM

## 2015-04-28 ENCOUNTER — Other Ambulatory Visit: Payer: BLUE CROSS/BLUE SHIELD

## 2015-04-28 ENCOUNTER — Ambulatory Visit
Admission: RE | Admit: 2015-04-28 | Discharge: 2015-04-28 | Disposition: A | Discharge status: Home / Self Care | Payer: BLUE CROSS/BLUE SHIELD | Source: Ambulatory Visit | Attending: Gynecology | Admitting: Gynecology

## 2015-04-28 ENCOUNTER — Other Ambulatory Visit: Payer: Self-pay | Admitting: Gynecology

## 2015-04-28 DIAGNOSIS — N6489 Other specified disorders of breast: Secondary | ICD-10-CM | POA: Diagnosis not present

## 2015-04-28 DIAGNOSIS — R928 Other abnormal and inconclusive findings on diagnostic imaging of breast: Secondary | ICD-10-CM | POA: Diagnosis not present

## 2015-05-12 ENCOUNTER — Inpatient Hospital Stay: Admission: RE | Admit: 2015-05-12 | Payer: BLUE CROSS/BLUE SHIELD | Source: Ambulatory Visit

## 2015-05-17 DIAGNOSIS — H40013 Open angle with borderline findings, low risk, bilateral: Secondary | ICD-10-CM | POA: Diagnosis not present

## 2015-05-17 DIAGNOSIS — H11153 Pinguecula, bilateral: Secondary | ICD-10-CM | POA: Diagnosis not present

## 2015-05-19 ENCOUNTER — Telehealth: Payer: Self-pay | Admitting: *Deleted

## 2015-05-19 ENCOUNTER — Encounter: Payer: Self-pay | Admitting: Gynecology

## 2015-05-19 ENCOUNTER — Ambulatory Visit (INDEPENDENT_AMBULATORY_CARE_PROVIDER_SITE_OTHER): Payer: BLUE CROSS/BLUE SHIELD | Admitting: Gynecology

## 2015-05-19 VITALS — BP 118/76

## 2015-05-19 DIAGNOSIS — R928 Other abnormal and inconclusive findings on diagnostic imaging of breast: Secondary | ICD-10-CM

## 2015-05-19 NOTE — Patient Instructions (Signed)
Office will contact you to arrange an appointment with a general surgeon.

## 2015-05-19 NOTE — Progress Notes (Signed)
    [facility] 27-Jul-1960 [id number]        55 y.o.  G0P0 presents with questions about her most recent mammogram and recommended biopsy. Patient has been followed for a distortion in the upper central right breast posterior depth. Relates having 4 biopsies over the years in this area all of which were benign. They again saw an area of distortion with impression of "suspicious distortion". They've recommended a tomosynthesis guided biopsy. The patient is getting frustrated with all the repeat biopsies and asked for my opinion.  Past medical history,surgical history, problem list, medications, allergies, family history and social history were all reviewed and documented in the EPIC chart.  Directed ROS with pertinent positives and negatives documented in the history of present illness/assessment and plan.  Exam: Kennon PortelaKim Gardner assistant Filed Vitals:   05/19/15 0840  BP: 118/76   General appearance:  Normal Both breast examined lying and sitting without masses, retractions, discharge, adenopathy.  Assessment/Plan:  55 y.o. 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  Options for management to include repeat biopsy now versus consult with general surgeon for their opinion possible lumpectomy in this area to remove this from future confusion and definitive diagnosis. Patient agrees with appointment with general surgeon we will go ahead and arrange this for her.    Dara LordsFONTAINE,Quaid Yeakle P MD, 8:58 AM 05/19/2015

## 2015-05-19 NOTE — Telephone Encounter (Signed)
[facility] they will contact pt to schedule and fax me back time and date.

## 2015-05-19 NOTE — Telephone Encounter (Signed)
-----   Message from Dara Lordsimothy P Fontaine, MD sent at 05/19/2015  9:02 AM EDT ----- General surgery either Dr. Larey Brickodd Rosenbauer or Dr. Darnell Levelodd Gerkin reference persistent distortion right breast with repetitive biopsies. My question is [place] [address] [place] Epic so they can look at all per records. If not then she will need copies of her previous mammogram reports, my last office note and her previous breast biopsy pathology results

## 2015-05-20 NOTE — Telephone Encounter (Signed)
Pt aware, appt on 05/24/15 @ 8:30am with Dr.Gerkin

## 2015-05-24 ENCOUNTER — Ambulatory Visit: Payer: Self-pay | Admitting: Surgery

## 2015-05-24 DIAGNOSIS — N6019 Diffuse cystic mastopathy of unspecified breast: Secondary | ICD-10-CM | POA: Diagnosis not present

## 2015-05-24 DIAGNOSIS — R928 Other abnormal and inconclusive findings on diagnostic imaging of breast: Secondary | ICD-10-CM | POA: Diagnosis not present

## 2015-07-06 ENCOUNTER — Encounter: Payer: Self-pay | Admitting: Gynecology

## 2015-07-06 ENCOUNTER — Ambulatory Visit (INDEPENDENT_AMBULATORY_CARE_PROVIDER_SITE_OTHER): Payer: BLUE CROSS/BLUE SHIELD | Admitting: Gynecology

## 2015-07-06 VITALS — BP 124/80 | Ht 64.0 in | Wt 146.0 lb

## 2015-07-06 DIAGNOSIS — Z8041 Family history of malignant neoplasm of ovary: Secondary | ICD-10-CM | POA: Diagnosis not present

## 2015-07-06 DIAGNOSIS — M810 Age-related osteoporosis without current pathological fracture: Secondary | ICD-10-CM

## 2015-07-06 DIAGNOSIS — Z13220 Encounter for screening for lipoid disorders: Secondary | ICD-10-CM | POA: Diagnosis not present

## 2015-07-06 DIAGNOSIS — N952 Postmenopausal atrophic vaginitis: Secondary | ICD-10-CM

## 2015-07-06 DIAGNOSIS — Z01419 Encounter for gynecological examination (general) (routine) without abnormal findings: Secondary | ICD-10-CM

## 2015-07-06 NOTE — Progress Notes (Signed)
    Kaylie Ritter [facility] 1960-11-08 [id number]        55 y.o.  G0P0  for annual exam.  Several issues noted below.  Past medical history,surgical history, problem list, medications, allergies, family history and social history were all reviewed and documented as reviewed in the EPIC chart.  ROS:  Performed with pertinent positives and negatives included in the history, assessment and plan.   Additional significant findings :  None   Exam: Leanne Lovely Vitals:   07/06/15 1526  BP: 124/80  Height: '5\' 4"'$  (1.626 m)  Weight: 146 lb (66.225 kg)   General appearance:  Normal affect, orientation and appearance. Skin: Grossly normal HEENT: Without gross lesions.  No cervical or supraclavicular adenopathy. Thyroid normal.  Lungs:  Clear without wheezing, rales or rhonchi Cardiac: RR, without RMG Abdominal:  Soft, nontender, without masses, guarding, rebound, organomegaly or hernia Breasts:  Examined lying and sitting without masses, retractions, discharge or axillary adenopathy. Pelvic:  Ext/BUS/vagina with atrophic changes  Cervix with atrophic changes  Uterus anteverted, normal size, shape and contour, midline and mobile nontender   Adnexa without masses or tenderness    Anus and perineum normal   Rectovaginal normal sphincter tone without palpated masses or tenderness.    Assessment/Plan:  55 y.o. G0P0 female for annual exam.   1. Postmenopausal/atrophic genital changes. Without significant hot flushes, night sweats, vaginal dryness or any vaginal bleeding. Continue to monitor and report any issues or vaginal bleeding. 2. Osteoporosis. DEXA 2015 T score -2.9. Attempted Fosamax with GI upset. Ultimately decided to initiate Prolia as discussed last year. Patient never followed up for this having seen her dentist and was afraid of jaw issues. I reviewed with her the risk and if it ratio of treatment versus nontreatment. The risk of fracture versus risk of side effects from the  medication. Risk of fracture clearly exceeds the risk of side effects. 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 Patient reluctant to start now. Recommended baseline DEXA now as a new baseline and then we will rediscuss. She did have a secondary workup negative with the exception of she did not return the 24 hour urine calcium excretion. I order that last year but again she did not return it and I encouraged her to go ahead and do so. She still has the check at home and agrees to arrange to do so. 3. Family history of ovarian cancer. 2 elderly maternal aunts with ovarian cancer in a maternal cousin with breast cancer. The cousin BRCA gene. I discussed with her on multiple occasions the options for genetic testing and she has declined. She does do surveillance ultrasounds with CA-125 recognizing the false negative and false positive rates.  she wants to go ahead and do this year and I scheduled an ultrasound. We'll go ahead and draw the CA-125 today. 4. Pap smear/HPV 03/2013 negative.  no Pap smear done today. No history of significant abnormal Pap smears. 5. Colonoscopy 2014. Repeat at their recommended interval. 6. Health maintenance. Patient requests baseline labs. CBC, CMP, lipid profile, urinalysis, vitamin D, TSH, CA-125 ordered. Patient has standing order for 24 hour urine calcium excretion and will return with this. Follow up for her ultrasound and DEXA. Follow up in one year for annual exam.   Anastasio Auerbach MD, 4:18 PM 07/06/2015

## 2015-07-06 NOTE — Patient Instructions (Signed)
Follow up for the ultrasound and bone density as scheduled.  Return the 24 hour urine calcium collection

## 2015-07-07 LAB — URINALYSIS W MICROSCOPIC + REFLEX CULTURE
BACTERIA UA: NONE SEEN [HPF]
BILIRUBIN URINE: NEGATIVE
CASTS: NONE SEEN [LPF]
Glucose, UA: NEGATIVE
Hgb urine dipstick: NEGATIVE
Leukocytes, UA: NEGATIVE
Nitrite: NEGATIVE
PROTEIN: NEGATIVE
SPECIFIC GRAVITY, URINE: 1.026 (ref 1.001–1.035)
Squamous Epithelial / LPF: NONE SEEN [HPF] (ref <=5)
Yeast: NONE SEEN [HPF]
pH: 6 (ref 5.0–8.0)

## 2015-07-08 LAB — URINE CULTURE
COLONY COUNT: NO GROWTH
Organism ID, Bacteria: NO GROWTH

## 2015-08-02 ENCOUNTER — Encounter: Payer: Self-pay | Admitting: Gynecology

## 2015-08-02 ENCOUNTER — Telehealth: Payer: Self-pay | Admitting: Gynecology

## 2015-08-02 ENCOUNTER — Ambulatory Visit (INDEPENDENT_AMBULATORY_CARE_PROVIDER_SITE_OTHER): Payer: BLUE CROSS/BLUE SHIELD

## 2015-08-02 ENCOUNTER — Other Ambulatory Visit: Payer: BLUE CROSS/BLUE SHIELD

## 2015-08-02 DIAGNOSIS — M810 Age-related osteoporosis without current pathological fracture: Secondary | ICD-10-CM | POA: Diagnosis not present

## 2015-08-02 NOTE — Telephone Encounter (Signed)
Outpatient her bone density continues to show osteoporosis. It is stable from her prior study without significant additional calcium loss. Options are again to consider treatment as we have discussed in the past versus observation with repeat bone density in 2 years.

## 2015-08-02 NOTE — Telephone Encounter (Signed)
Pt informed with the below note. 

## 2015-08-03 ENCOUNTER — Ambulatory Visit (INDEPENDENT_AMBULATORY_CARE_PROVIDER_SITE_OTHER): Payer: BLUE CROSS/BLUE SHIELD | Admitting: Gynecology

## 2015-08-03 ENCOUNTER — Other Ambulatory Visit: Payer: Self-pay | Admitting: Gynecology

## 2015-08-03 ENCOUNTER — Ambulatory Visit (INDEPENDENT_AMBULATORY_CARE_PROVIDER_SITE_OTHER): Payer: BLUE CROSS/BLUE SHIELD

## 2015-08-03 ENCOUNTER — Other Ambulatory Visit: Payer: BLUE CROSS/BLUE SHIELD

## 2015-08-03 ENCOUNTER — Encounter: Payer: Self-pay | Admitting: Gynecology

## 2015-08-03 VITALS — BP 120/74

## 2015-08-03 DIAGNOSIS — Z8041 Family history of malignant neoplasm of ovary: Secondary | ICD-10-CM | POA: Diagnosis not present

## 2015-08-03 DIAGNOSIS — N83202 Unspecified ovarian cyst, left side: Secondary | ICD-10-CM

## 2015-08-03 DIAGNOSIS — Z13220 Encounter for screening for lipoid disorders: Secondary | ICD-10-CM | POA: Diagnosis not present

## 2015-08-03 DIAGNOSIS — Z01419 Encounter for gynecological examination (general) (routine) without abnormal findings: Secondary | ICD-10-CM | POA: Diagnosis not present

## 2015-08-03 DIAGNOSIS — M810 Age-related osteoporosis without current pathological fracture: Secondary | ICD-10-CM | POA: Diagnosis not present

## 2015-08-03 DIAGNOSIS — N941 Unspecified dyspareunia: Secondary | ICD-10-CM

## 2015-08-03 LAB — COMPREHENSIVE METABOLIC PANEL
ALBUMIN: 4.1 g/dL (ref 3.6–5.1)
ALK PHOS: 69 U/L (ref 33–130)
ALT: 53 U/L — ABNORMAL HIGH (ref 6–29)
AST: 37 U/L — ABNORMAL HIGH (ref 10–35)
BUN: 16 mg/dL (ref 7–25)
CALCIUM: 9 mg/dL (ref 8.6–10.4)
CHLORIDE: 103 mmol/L (ref 98–110)
CO2: 23 mmol/L (ref 20–31)
Creat: 0.61 mg/dL (ref 0.50–1.05)
Glucose, Bld: 105 mg/dL — ABNORMAL HIGH (ref 65–99)
POTASSIUM: 4.5 mmol/L (ref 3.5–5.3)
Sodium: 138 mmol/L (ref 135–146)
TOTAL PROTEIN: 6.7 g/dL (ref 6.1–8.1)
Total Bilirubin: 0.4 mg/dL (ref 0.2–1.2)

## 2015-08-03 LAB — TSH: TSH: 1.48 mIU/L

## 2015-08-03 LAB — LIPID PANEL
CHOL/HDL RATIO: 4 ratio (ref <=5.0)
CHOLESTEROL: 290 mg/dL — AB (ref 125–200)
HDL: 73 mg/dL (ref >=46)
LDL Cholesterol: 180 mg/dL — ABNORMAL HIGH (ref <130)
TRIGLYCERIDES: 184 mg/dL — AB (ref <150)
VLDL: 37 mg/dL — AB (ref <30)

## 2015-08-03 NOTE — Patient Instructions (Signed)
Follow up ultrasound 3 months

## 2015-08-03 NOTE — Progress Notes (Signed)
    Joann Johnson [facility] 03/23/1960 [id number]        55 y.o.  G0P0 presents for ultrasound due to family history of ovarian cancer. Just had blood work done today to include CA-125. Recent bone density showed osteoporosis as discussed below.    Past medical history,surgical history, problem list, medications, allergies, family history and social history were all reviewed and documented in the EPIC chart.  Directed ROS with pertinent positives and negatives documented in the history of present illness/assessment and plan.  Exam:  Vitals:   08/03/15 0959  BP: 120/74   General appearance:  Normal  Ultrasound shows uterus normal size and echotexture. Endometrial echo 2.8 mm. Right ovary normal. Left ovary with small echo-free cyst 5 x 6 mm. Echogenic focus 8 x 8 mm. Cul-de-sac negative.   Assessment/Plan:  55 y.o. G0P0 with:  1. Small echogenic focus left ovary. Reviewed prior ultrasounds and not apparent on those pictures. Did have small cyst on prior ultrasounds. Given her history recommended follow up ultrasound in 3 months just relook at this area. Patient will schedule follow up for this. 2. Osteoporosis. T score -2.9 AP spine. Comparing to last DEXA stable at all measured sites. Patient notes being more active with walking and taking calcium/vitamin D. I again reviewed options to include treatment now versus observation with follow up DEXA in 2 years recognizing possible progressive loss and current increased fracture risk based on T score. After a lengthy discussion the patient wants to follow for now and repeat in 2 years.  Greater than 50% of my time was spent in direct face to face counseling and coordination of care with the patient.     Dara Lords MD, 10:31 AM 08/03/2015

## 2015-08-04 ENCOUNTER — Other Ambulatory Visit: Payer: Self-pay | Admitting: Gynecology

## 2015-08-04 DIAGNOSIS — E7800 Pure hypercholesterolemia, unspecified: Secondary | ICD-10-CM

## 2015-08-04 DIAGNOSIS — N83202 Unspecified ovarian cyst, left side: Secondary | ICD-10-CM

## 2015-08-04 DIAGNOSIS — E782 Mixed hyperlipidemia: Secondary | ICD-10-CM

## 2015-08-04 DIAGNOSIS — R7309 Other abnormal glucose: Secondary | ICD-10-CM

## 2015-08-04 DIAGNOSIS — R748 Abnormal levels of other serum enzymes: Secondary | ICD-10-CM

## 2015-08-04 DIAGNOSIS — E559 Vitamin D deficiency, unspecified: Secondary | ICD-10-CM

## 2015-08-04 LAB — CBC WITH DIFFERENTIAL/PLATELET
BASOS ABS: 0 {cells}/uL (ref 0–200)
Basophils Relative: 0 %
EOS ABS: 136 {cells}/uL (ref 15–500)
Eosinophils Relative: 2 %
HEMATOCRIT: 39.8 % (ref 35.0–45.0)
Hemoglobin: 13.1 g/dL (ref 11.7–15.5)
LYMPHS ABS: 2584 {cells}/uL (ref 850–3900)
Lymphocytes Relative: 38 %
MCH: 33 pg (ref 27.0–33.0)
MCHC: 32.9 g/dL (ref 32.0–36.0)
MCV: 100.3 fL — AB (ref 80.0–100.0)
MONO ABS: 408 {cells}/uL (ref 200–950)
MONOS PCT: 6 %
MPV: 10.1 fL (ref 7.5–12.5)
NEUTROS ABS: 3672 {cells}/uL (ref 1500–7800)
Neutrophils Relative %: 54 %
PLATELETS: 308 10*3/uL (ref 140–400)
RBC: 3.97 MIL/uL (ref 3.80–5.10)
RDW: 13.5 % (ref 11.0–15.0)
WBC: 6.8 10*3/uL (ref 3.8–10.8)

## 2015-08-04 LAB — CA 125: CA 125: 8 U/mL (ref <35)

## 2015-08-04 LAB — VITAMIN D 25 HYDROXY (VIT D DEFICIENCY, FRACTURES): Vit D, 25-Hydroxy: 24 ng/mL — ABNORMAL LOW (ref 30–100)

## 2015-09-01 ENCOUNTER — Telehealth: Payer: Self-pay

## 2015-09-01 NOTE — Telephone Encounter (Signed)
Osborne OmanKathy McConnell at [facility] called to see if I had any information regarding patient was recommended to have biopsy after he mammo in April. I called and spoke with patient. Patient said after consulting with Dr. Velvet BatheF she went and saw Dr. Gerrit FriendsGerkin. Dr. Reece AgarG indicated there was not a rush on it and she plans to schedule it in September.  I called Olegario MessierKathy back and left message.

## 2015-09-19 ENCOUNTER — Ambulatory Visit: Payer: Self-pay | Admitting: Surgery

## 2015-09-19 DIAGNOSIS — R928 Other abnormal and inconclusive findings on diagnostic imaging of breast: Secondary | ICD-10-CM

## 2015-09-22 ENCOUNTER — Other Ambulatory Visit: Payer: Self-pay | Admitting: Surgery

## 2015-09-22 DIAGNOSIS — R928 Other abnormal and inconclusive findings on diagnostic imaging of breast: Secondary | ICD-10-CM

## 2015-09-29 ENCOUNTER — Encounter (HOSPITAL_BASED_OUTPATIENT_CLINIC_OR_DEPARTMENT_OTHER): Payer: Self-pay | Admitting: *Deleted

## 2015-10-04 ENCOUNTER — Encounter (HOSPITAL_BASED_OUTPATIENT_CLINIC_OR_DEPARTMENT_OTHER): Payer: Self-pay | Admitting: Surgery

## 2015-10-04 DIAGNOSIS — R928 Other abnormal and inconclusive findings on diagnostic imaging of breast: Secondary | ICD-10-CM | POA: Diagnosis present

## 2015-10-04 DIAGNOSIS — N6019 Diffuse cystic mastopathy of unspecified breast: Secondary | ICD-10-CM | POA: Diagnosis present

## 2015-10-04 NOTE — H&P (Signed)
General Surgery [facility]  Belinda FisherLisa D. Lobue DOB: October 21, 1960 Married / Language: English / Race: White Female  History of Present Illness  The patient is a 55 year old female who presents with a complaint of Breast problems.  Patient is referred by Dr. Chiquita Lothim Fontaine for evaluation of abnormal mammogram. Patient's primary care physician is Dr. Catha GosselinKevin Little. Patient has had a two-year history of an abnormality found on mammogram in the right breast. This was an area of distortion. She underwent previous needle biopsy showing fibrocystic changes and fibroadenoma. However this was felt to be discordant with the mammographic findings. Most recent mamm[place] [facility] [place]. This showed an area of distortion in the upper central right breast located posteriorly. Marking clips were present. No palpable abnormality was identified. Targeted ultrasound showed no correlation with a distortion. Biopsy was recommended. Patient has had a bad experience with core needle biopsy. She is also interested in having the lesion completely removed so as not to raise future concerns. Patient has no prior history of breast disease. She has had no prior biopsies other than the core needle biopsies performed on this lesion in the right breast. There is a family history of breast cancer and 3 maternal cousins. No immediate family members with breast disease. Patient denies any palpable masses. She denies any nipple discharge.   Other Problems High blood pressure Hypercholesterolemia  Past Surgical History Breast Biopsy Right. multiple Colon Polyp Removal - Colonoscopy  Diagnostic Studies History Colonoscopy within last year Mammogram within last year Pap Smear 1-5 years ago  Allergies Iodinated Diagnostic Agents  Medication History Metoprolol Succinate ER (25MG  Tablet ER 24HR, Oral) Active. Vitamin D (2000UNIT Capsule,  Oral) Active. Xanax (0.25MG  Tablet, Oral as needed) Active. Losartan Potassium (50MG  Tablet, Oral) Active. Medications Reconciled  Social History Alcohol use Moderate alcohol use. Caffeine use Coffee, Tea. No drug use Tobacco use Never smoker.  Family History Cerebrovascular Accident Father. Heart Disease Father, Mother.  Pregnancy / Birth History Age at menarche 11 years. Age of menopause <43 Gravida 0 Irregular periods Para 0  Review of Systems General Present- Fatigue and Weight Gain. Not Present- Appetite Loss, Chills, Fever, Night Sweats and Weight Loss. HEENT Present- Ringing in the Ears and Wears glasses/contact lenses. Not Present- Earache, Hearing Loss, Hoarseness, Nose Bleed, Oral Ulcers, Seasonal Allergies, Sinus Pain, Sore Throat, Visual Disturbances and Yellow Eyes. Gastrointestinal Not Present- Abdominal Pain, Bloating, Bloody Stool, Change in Bowel Habits, Chronic diarrhea, Constipation, Difficulty Swallowing, Excessive gas, Gets full quickly at meals, Hemorrhoids, Indigestion, Nausea, Rectal Pain and Vomiting. Female Genitourinary Not Present- Frequency, Nocturia, Painful Urination, Pelvic Pain and Urgency. Neurological Not Present- Decreased Memory, Fainting, Headaches, Numbness, Seizures, Tingling, Tremor, Trouble walking and Weakness. Endocrine Not Present- Cold Intolerance, Excessive Hunger, Hair Changes, Heat Intolerance, Hot flashes and New Diabetes. Hematology Present- Easy Bruising. Not Present- Excessive bleeding, Gland problems, HIV and Persistent Infections.  Vitals Weight: 146 lb Height: 62in Body Surface Area: 1.67 m Body Mass Index: 26.7 kg/m  Pulse: 79 (Regular)  BP: 130/80 (Sitting, Left Arm, Standard)  Physical Exam The physical exam findings are as follows: Note:General - appears comfortable, no distress; not diaphorectic  HEENT - normocephalic; sclerae clear, gaze conjugate; mucous membranes moist, dentition good;  voice normal  Neck - symmetric on extension; no palpable anterior or posterior cervical adenopathy; no palpable masses in the thyroid bed  Chest - clear bilaterally without rhonchi, rales, or wheeze  Cor -  regular rhythm with normal rate; no significant murmur  Breast - right breast shows normal nipple areolar complex; breast parenchyma is relatively dense, slightly nodular, without discrete or dominant mass; right axilla is without adenopathy. Left breast shows normal nipple areolar complex. Parenchyma is again dense with slight nodularity. No dominant or discrete masses. Left axilla without adenopathy. No significant tenderness bilaterally.  Ext - non-tender without significant edema or lymphedema  Neuro - grossly intact; no tremor   Assessment & Plan  ABNORMAL MAMMOGRAM OF RIGHT BREAST (R92.8) FIBROCYSTIC BREAST (N60.19)  Patient presents with abnormal mammogram of the right breast and 2 previous biopsies with discordant pathologic results. Patient is accompanied today by her husband.  After review of her diagnostic studies and a thorough history, we discussed options for management including observation, repeat needle biopsy, or surgical excisional biopsy with wire localization. The most definitive answer and definitive management of the lesion would be complete removal with surgical biopsy. This could be performed as an outpatient surgical procedure. We discussed risk of malignancy and need for additional surgery. Patient understands and wishes to proceed with surgery in the near future.  The risks and benefits of the procedure have been discussed at length with the patient. The patient understands the proposed procedure, potential alternative treatments, and the course of recovery to be expected. All of the patient's questions have been answered at this time. The patient wishes to proceed with surgery.   Velora Heckler, MD, [facility] Office:  [phone]

## 2015-10-05 ENCOUNTER — Encounter (HOSPITAL_BASED_OUTPATIENT_CLINIC_OR_DEPARTMENT_OTHER)
Admission: RE | Admit: 2015-10-05 | Discharge: 2015-10-05 | Disposition: A | Discharge status: Home / Self Care | Payer: BLUE CROSS/BLUE SHIELD | Source: Ambulatory Visit | Attending: Surgery | Admitting: Surgery

## 2015-10-05 DIAGNOSIS — Z79899 Other long term (current) drug therapy: Secondary | ICD-10-CM | POA: Diagnosis not present

## 2015-10-05 DIAGNOSIS — Z803 Family history of malignant neoplasm of breast: Secondary | ICD-10-CM | POA: Diagnosis not present

## 2015-10-05 DIAGNOSIS — N6091 Unspecified benign mammary dysplasia of right breast: Secondary | ICD-10-CM | POA: Diagnosis not present

## 2015-10-05 DIAGNOSIS — I10 Essential (primary) hypertension: Secondary | ICD-10-CM | POA: Diagnosis not present

## 2015-10-05 DIAGNOSIS — R928 Other abnormal and inconclusive findings on diagnostic imaging of breast: Secondary | ICD-10-CM | POA: Diagnosis not present

## 2015-10-05 LAB — BASIC METABOLIC PANEL
ANION GAP: 9 (ref 5–15)
BUN: 11 mg/dL (ref 6–20)
CHLORIDE: 105 mmol/L (ref 101–111)
CO2: 25 mmol/L (ref 22–32)
Calcium: 9.6 mg/dL (ref 8.9–10.3)
Creatinine, Ser: 0.57 mg/dL (ref 0.44–1.00)
GFR calc non Af Amer: >60 mL/min (ref >60)
GLUCOSE: 103 mg/dL — AB (ref 65–99)
POTASSIUM: 5.1 mmol/L (ref 3.5–5.1)
Sodium: 139 mmol/L (ref 135–145)

## 2015-10-06 ENCOUNTER — Ambulatory Visit (HOSPITAL_BASED_OUTPATIENT_CLINIC_OR_DEPARTMENT_OTHER): Payer: BLUE CROSS/BLUE SHIELD | Admitting: Anesthesiology

## 2015-10-06 ENCOUNTER — Encounter (HOSPITAL_BASED_OUTPATIENT_CLINIC_OR_DEPARTMENT_OTHER)
Admission: RE | Disposition: A | Discharge status: Home / Self Care | Payer: Self-pay | Source: Ambulatory Visit | Attending: Surgery

## 2015-10-06 ENCOUNTER — Ambulatory Visit
Admission: RE | Admit: 2015-10-06 | Discharge: 2015-10-06 | Disposition: A | Discharge status: Home / Self Care | Payer: BLUE CROSS/BLUE SHIELD | Source: Ambulatory Visit | Attending: Surgery | Admitting: Surgery

## 2015-10-06 ENCOUNTER — Ambulatory Visit (HOSPITAL_BASED_OUTPATIENT_CLINIC_OR_DEPARTMENT_OTHER)
Admission: RE | Admit: 2015-10-06 | Discharge: 2015-10-06 | Disposition: A | Discharge status: Home / Self Care | Payer: BLUE CROSS/BLUE SHIELD | Source: Ambulatory Visit | Attending: Surgery | Admitting: Surgery

## 2015-10-06 ENCOUNTER — Encounter (HOSPITAL_BASED_OUTPATIENT_CLINIC_OR_DEPARTMENT_OTHER): Payer: Self-pay | Admitting: Anesthesiology

## 2015-10-06 DIAGNOSIS — I10 Essential (primary) hypertension: Secondary | ICD-10-CM | POA: Insufficient documentation

## 2015-10-06 DIAGNOSIS — R928 Other abnormal and inconclusive findings on diagnostic imaging of breast: Secondary | ICD-10-CM

## 2015-10-06 DIAGNOSIS — R920 Mammographic microcalcification found on diagnostic imaging of breast: Secondary | ICD-10-CM | POA: Diagnosis not present

## 2015-10-06 DIAGNOSIS — N6081 Other benign mammary dysplasias of right breast: Secondary | ICD-10-CM | POA: Diagnosis not present

## 2015-10-06 DIAGNOSIS — N6091 Unspecified benign mammary dysplasia of right breast: Secondary | ICD-10-CM | POA: Diagnosis not present

## 2015-10-06 DIAGNOSIS — N6489 Other specified disorders of breast: Secondary | ICD-10-CM | POA: Diagnosis not present

## 2015-10-06 DIAGNOSIS — N6019 Diffuse cystic mastopathy of unspecified breast: Secondary | ICD-10-CM | POA: Diagnosis present

## 2015-10-06 DIAGNOSIS — Z79899 Other long term (current) drug therapy: Secondary | ICD-10-CM | POA: Insufficient documentation

## 2015-10-06 DIAGNOSIS — Z803 Family history of malignant neoplasm of breast: Secondary | ICD-10-CM | POA: Insufficient documentation

## 2015-10-06 DIAGNOSIS — N6011 Diffuse cystic mastopathy of right breast: Secondary | ICD-10-CM | POA: Diagnosis not present

## 2015-10-06 HISTORY — PX: BREAST EXCISIONAL BIOPSY: SUR124

## 2015-10-06 HISTORY — PX: BREAST BIOPSY: SHX20

## 2015-10-06 SURGERY — BREAST BIOPSY WITH NEEDLE LOCALIZATION
Anesthesia: General | Site: Breast | Laterality: Right

## 2015-10-06 MED ORDER — DEXAMETHASONE SODIUM PHOSPHATE 10 MG/ML IJ SOLN
INTRAMUSCULAR | Status: AC
  Filled 2015-10-06: qty 1

## 2015-10-06 MED ORDER — PROMETHAZINE HCL 25 MG/ML IJ SOLN: 6.2500 mg | INTRAMUSCULAR | Status: DC | PRN

## 2015-10-06 MED ORDER — HYDROCODONE-ACETAMINOPHEN 5-325 MG PO TABS
1.0000 | ORAL_TABLET | Freq: Once | ORAL | Status: AC
  Administered 2015-10-06: 1 via ORAL

## 2015-10-06 MED ORDER — FENTANYL CITRATE (PF) 100 MCG/2ML IJ SOLN
INTRAMUSCULAR | Status: AC
  Filled 2015-10-06: qty 2

## 2015-10-06 MED ORDER — LACTATED RINGERS IV SOLN
INTRAVENOUS | Status: DC
  Administered 2015-10-06: 15:00:00 via INTRAVENOUS

## 2015-10-06 MED ORDER — GLYCOPYRROLATE 0.2 MG/ML IJ SOLN: 0.2000 mg | Freq: Once | INTRAMUSCULAR | Status: DC | PRN

## 2015-10-06 MED ORDER — PROPOFOL 10 MG/ML IV BOLUS
INTRAVENOUS | Status: DC | PRN
  Administered 2015-10-06: 200 mg via INTRAVENOUS

## 2015-10-06 MED ORDER — MIDAZOLAM HCL 2 MG/2ML IJ SOLN
INTRAMUSCULAR | Status: AC
  Filled 2015-10-06: qty 2

## 2015-10-06 MED ORDER — ONDANSETRON HCL 4 MG/2ML IJ SOLN
INTRAMUSCULAR | Status: AC
  Filled 2015-10-06: qty 2

## 2015-10-06 MED ORDER — FENTANYL CITRATE (PF) 100 MCG/2ML IJ SOLN
INTRAMUSCULAR | Status: AC
Start: 2015-10-06 — End: 2015-10-06
  Filled 2015-10-06: qty 2

## 2015-10-06 MED ORDER — CEFAZOLIN SODIUM-DEXTROSE 2-4 GM/100ML-% IV SOLN
2.0000 g | INTRAVENOUS | Status: AC
  Administered 2015-10-06: 2 g via INTRAVENOUS

## 2015-10-06 MED ORDER — HYDROMORPHONE HCL 1 MG/ML IJ SOLN
INTRAMUSCULAR | Status: AC
  Filled 2015-10-06: qty 1

## 2015-10-06 MED ORDER — LIDOCAINE HCL (PF) 1 % IJ SOLN
INTRAMUSCULAR | Status: AC
  Filled 2015-10-06: qty 30

## 2015-10-06 MED ORDER — BUPIVACAINE HCL (PF) 0.5 % IJ SOLN
INTRAMUSCULAR | Status: AC
  Filled 2015-10-06: qty 30

## 2015-10-06 MED ORDER — MIDAZOLAM HCL 2 MG/2ML IJ SOLN
1.0000 mg | INTRAMUSCULAR | Status: DC | PRN
  Administered 2015-10-06: 2 mg via INTRAVENOUS

## 2015-10-06 MED ORDER — CHLORHEXIDINE GLUCONATE CLOTH 2 % EX PADS: 6.0000 | MEDICATED_PAD | Freq: Once | CUTANEOUS | Status: DC

## 2015-10-06 MED ORDER — LIDOCAINE 2% (20 MG/ML) 5 ML SYRINGE
INTRAMUSCULAR | Status: AC
  Filled 2015-10-06: qty 5

## 2015-10-06 MED ORDER — BUPIVACAINE HCL (PF) 0.5 % IJ SOLN
INTRAMUSCULAR | Status: DC | PRN
  Administered 2015-10-06: 5 mL

## 2015-10-06 MED ORDER — CEFAZOLIN SODIUM-DEXTROSE 2-4 GM/100ML-% IV SOLN
INTRAVENOUS | Status: AC
  Filled 2015-10-06: qty 100

## 2015-10-06 MED ORDER — DEXAMETHASONE SODIUM PHOSPHATE 4 MG/ML IJ SOLN
INTRAMUSCULAR | Status: DC | PRN
  Administered 2015-10-06: 10 mg via INTRAVENOUS

## 2015-10-06 MED ORDER — HYDROCODONE-ACETAMINOPHEN 7.5-325 MG PO TABS: 1.0000 | ORAL_TABLET | Freq: Once | ORAL | Status: DC | PRN

## 2015-10-06 MED ORDER — HYDROCODONE-ACETAMINOPHEN 5-325 MG PO TABS
ORAL_TABLET | ORAL | Status: AC
  Filled 2015-10-06: qty 1

## 2015-10-06 MED ORDER — HYDROCODONE-ACETAMINOPHEN 5-325 MG PO TABS: 1.0000 | ORAL_TABLET | ORAL | 0 refills | Status: DC | PRN

## 2015-10-06 MED ORDER — HYDROMORPHONE HCL 1 MG/ML IJ SOLN
0.2500 mg | INTRAMUSCULAR | Status: DC | PRN
  Administered 2015-10-06: 0.5 mg via INTRAVENOUS

## 2015-10-06 MED ORDER — FENTANYL CITRATE (PF) 100 MCG/2ML IJ SOLN
50.0000 ug | INTRAMUSCULAR | Status: AC | PRN
  Administered 2015-10-06: 100 ug via INTRAVENOUS
  Administered 2015-10-06 (×2): 50 ug via INTRAVENOUS

## 2015-10-06 MED ORDER — LIDOCAINE 2% (20 MG/ML) 5 ML SYRINGE
INTRAMUSCULAR | Status: DC | PRN
  Administered 2015-10-06: 50 mg via INTRAVENOUS

## 2015-10-06 MED ORDER — SCOPOLAMINE 1 MG/3DAYS TD PT72: 1.0000 | MEDICATED_PATCH | Freq: Once | TRANSDERMAL | Status: DC | PRN

## 2015-10-06 SURGICAL SUPPLY — 43 items
APL SKNCLS STERI-STRIP NONHPOA (GAUZE/BANDAGES/DRESSINGS) ×1
BENZOIN TINCTURE PRP APPL 2/3 (GAUZE/BANDAGES/DRESSINGS) ×2 IMPLANT
BINDER BREAST LRG (GAUZE/BANDAGES/DRESSINGS) ×1 IMPLANT
BLADE HEX COATED 2.75 (ELECTRODE) ×2 IMPLANT
BLADE SURG 15 STRL LF DISP TIS (BLADE) ×1 IMPLANT
BLADE SURG 15 STRL SS (BLADE) ×2
CANISTER SUCT 1200ML W/VALVE (MISCELLANEOUS) IMPLANT
CHLORAPREP W/TINT 26ML (MISCELLANEOUS) ×2 IMPLANT
CLIP TI WIDE RED SMALL 6 (CLIP) IMPLANT
COVER BACK TABLE 60X90IN (DRAPES) ×2 IMPLANT
COVER MAYO STAND STRL (DRAPES) ×2 IMPLANT
DECANTER SPIKE VIAL GLASS SM (MISCELLANEOUS) IMPLANT
DEVICE DUBIN W/COMP PLATE 8390 (MISCELLANEOUS) ×1 IMPLANT
DRAPE LAPAROTOMY 100X72 PEDS (DRAPES) ×2 IMPLANT
DRAPE UTILITY XL STRL (DRAPES) ×2 IMPLANT
ELECT REM PT RETURN 9FT ADLT (ELECTROSURGICAL) ×2
ELECTRODE REM PT RTRN 9FT ADLT (ELECTROSURGICAL) ×1 IMPLANT
GLOVE BIOGEL M STRL SZ7.5 (GLOVE) ×1 IMPLANT
GLOVE BIOGEL PI IND STRL 8 (GLOVE) IMPLANT
GLOVE BIOGEL PI INDICATOR 8 (GLOVE) ×1
GLOVE SURG ORTHO 8.0 STRL STRW (GLOVE) ×2 IMPLANT
GOWN STRL REUS W/ TWL LRG LVL3 (GOWN DISPOSABLE) ×1 IMPLANT
GOWN STRL REUS W/ TWL XL LVL3 (GOWN DISPOSABLE) ×1 IMPLANT
GOWN STRL REUS W/TWL LRG LVL3 (GOWN DISPOSABLE) ×2
GOWN STRL REUS W/TWL XL LVL3 (GOWN DISPOSABLE) ×2
KIT MARKER MARGIN INK (KITS) ×1 IMPLANT
NDL HYPO 25X1 1.5 SAFETY (NEEDLE) ×1 IMPLANT
NEEDLE HYPO 25X1 1.5 SAFETY (NEEDLE) ×2 IMPLANT
NS IRRIG 1000ML POUR BTL (IV SOLUTION) ×2 IMPLANT
PACK BASIN DAY SURGERY FS (CUSTOM PROCEDURE TRAY) ×2 IMPLANT
PENCIL BUTTON HOLSTER BLD 10FT (ELECTRODE) ×2 IMPLANT
SLEEVE SCD COMPRESS KNEE MED (MISCELLANEOUS) ×1 IMPLANT
SPONGE LAP 4X18 X RAY DECT (DISPOSABLE) ×2 IMPLANT
STRIP CLOSURE SKIN 1/2X4 (GAUZE/BANDAGES/DRESSINGS) ×2 IMPLANT
SUT MNCRL AB 4-0 PS2 18 (SUTURE) ×3 IMPLANT
SUT VIC AB 3-0 SH 27 (SUTURE) ×2
SUT VIC AB 3-0 SH 27X BRD (SUTURE) ×1 IMPLANT
SUT VICRYL 4-0 PS2 18IN ABS (SUTURE) IMPLANT
SYR CONTROL 10ML LL (SYRINGE) ×2 IMPLANT
TOWEL OR 17X24 6PK STRL BLUE (TOWEL DISPOSABLE) ×3 IMPLANT
TOWEL OR NON WOVEN STRL DISP B (DISPOSABLE) ×2 IMPLANT
TUBE CONNECTING 20X1/4 (TUBING) IMPLANT
YANKAUER SUCT BULB TIP NO VENT (SUCTIONS) IMPLANT

## 2015-10-06 NOTE — Interval H&P Note (Signed)
History and Physical Interval Note:  10/06/2015 4:01 PM  Joann Johnson  has presented today for surgery, with the diagnosis of abnormal mammogram right breast  The various methods of treatment have been discussed with the patient and family. After consideration of risks, benefits and other options for treatment, the patient has consented to    Procedure(s): RIGHT BREAST BIOPSY WITH NEEDLE LOCALIZATION (Right) as a surgical intervention .    The patient's history has been reviewed, patient examined, no change in status, stable for surgery.  I have reviewed the patient's chart and labs.  Questions were answered to the patient's satisfaction.    Velora Hecklerodd M. Nalleli Largent, MD, [facility] Office: [phone]    [name]

## 2015-10-06 NOTE — Transfer of Care (Signed)
Immediate Anesthesia Transfer of Care Note  Patient: Joann Johnson  Procedure(s) Performed: Procedure(s): RIGHT BREAST BIOPSY WITH NEEDLE LOCALIZATION (Right)  Patient Location: PACU  Anesthesia Type:General  Level of Consciousness: awake, alert  and oriented  Airway & Oxygen Therapy: Patient Spontanous Breathing and Patient connected to face mask oxygen  Post-op Assessment: Report given to RN and Post -op Vital signs reviewed and stable  Post vital signs: Reviewed and stable  Last Vitals:  Vitals:   10/06/15 1423  BP: (!) 168/82  Pulse: 70  Resp: 16  Temp: 36.7 C    Last Pain:  Vitals:   10/06/15 1423  TempSrc: Oral  PainSc: 1          Complications: No apparent anesthesia complications

## 2015-10-06 NOTE — Anesthesia Preprocedure Evaluation (Signed)
Anesthesia Evaluation  Patient identified by MRN, date of birth, ID band Patient awake    Reviewed: Allergy & Precautions, NPO status , Patient's Chart, lab work & pertinent test results, reviewed documented beta blocker date and time   Airway Mallampati: II  TM Distance: >3 FB Neck ROM: Full    Dental  (+) Dental Advisory Given   Pulmonary neg pulmonary ROS,    breath sounds clear to auscultation       Cardiovascular hypertension, Pt. on medications and Pt. on home beta blockers  Rhythm:Regular Rate:Normal     Neuro/Psych negative neurological ROS     GI/Hepatic negative GI ROS, Neg liver ROS,   Endo/Other  negative endocrine ROS  Renal/GU negative Renal ROS     Musculoskeletal   Abdominal   Peds  Hematology negative hematology ROS (+)   Anesthesia Other Findings   Reproductive/Obstetrics                             Anesthesia Physical Anesthesia Plan  ASA: II  Anesthesia Plan: General   Post-op Pain Management:    Induction: Intravenous  Airway Management Planned: LMA  Additional Equipment:   Intra-op Plan:   Post-operative Plan: Extubation in OR  Informed Consent: I have reviewed the patients History and Physical, chart, labs and discussed the procedure including the risks, benefits and alternatives for the proposed anesthesia with the patient or authorized representative who has indicated his/her understanding and acceptance.   Dental advisory given  Plan Discussed with: CRNA  Anesthesia Plan Comments:         Anesthesia Quick Evaluation

## 2015-10-06 NOTE — Anesthesia Procedure Notes (Signed)
Procedure Name: LMA Insertion Date/Time: 10/06/2015 3:01 PM Performed by: Zenia ResidesPAYNE, Sonam Huelsmann D Pre-anesthesia Checklist: Patient identified, Emergency Drugs available, Suction available and Patient being monitored Patient Re-evaluated:Patient Re-evaluated prior to inductionOxygen Delivery Method: Circle system utilized Preoxygenation: Pre-oxygenation with 100% oxygen Intubation Type: IV induction Ventilation: Mask ventilation without difficulty LMA: LMA inserted LMA Size: 4.0 Number of attempts: 1 Airway Equipment and Method: Bite block Placement Confirmation: positive ETCO2 Tube secured with: Tape Dental Injury: Teeth and Oropharynx as per pre-operative assessment

## 2015-10-06 NOTE — Discharge Instructions (Signed)

## 2015-10-06 NOTE — Brief Op Note (Signed)
10/06/2015  4:02 PM  PATIENT:  Joann Johnson  55 y.o. female  PRE-OPERATIVE DIAGNOSIS:  abnormal mammogram right breast  POST-OPERATIVE DIAGNOSIS:  abnormal mammogram right breast  PROCEDURE:  Procedure(s): RIGHT BREAST BIOPSY WITH NEEDLE LOCALIZATION (Right)  SURGEON:  Surgeon(s) and Role:    * Darnell Levelodd Haydan Mansouri, MD - Primary  ANESTHESIA:   general  EBL:  Total I/O In: 1000 [I.V.:1000] Out: -   BLOOD ADMINISTERED:none  DRAINS: none   LOCAL MEDICATIONS USED:  MARCAINE     SPECIMEN:  Excision  DISPOSITION OF SPECIMEN:  PATHOLOGY  COUNTS:  YES  TOURNIQUET:  * No tourniquets in log *  DICTATION: .Other Dictation: Dictation Number [id number]  PLAN OF CARE: Other Dictation: Dictation Number [id number]  PATIENT DISPOSITION:  PACU - hemodynamically stable.   Delay start of Pharmacological VTE agent (>24hrs) due to surgical blood loss or risk of bleeding: yes  Velora Hecklerodd M. Epiphany Seltzer, MD, [facility] Office: [phone]

## 2015-10-06 NOTE — Anesthesia Postprocedure Evaluation (Signed)
Anesthesia Post Note  Patient: Joann Johnson  Procedure(s) Performed: Procedure(s) (LRB): RIGHT BREAST BIOPSY WITH NEEDLE LOCALIZATION (Right)  Patient location during evaluation: PACU Anesthesia Type: General Level of consciousness: awake and alert Pain management: pain level controlled Vital Signs Assessment: post-procedure vital signs reviewed and stable Respiratory status: spontaneous breathing, nonlabored ventilation, respiratory function stable and patient connected to nasal cannula oxygen Cardiovascular status: blood pressure returned to baseline and stable Postop Assessment: no signs of nausea or vomiting Anesthetic complications: no    Last Vitals:  Vitals:   10/06/15 1645 10/06/15 1711  BP: (!) 162/82 (!) 167/83  Pulse: 68 70  Resp: 11   Temp:  36.6 C    Last Pain:  Vitals:   10/06/15 1711  TempSrc: Oral  PainSc: 3                  Kennieth RadFitzgerald, Siegfried Vieth E

## 2015-10-07 ENCOUNTER — Encounter (HOSPITAL_BASED_OUTPATIENT_CLINIC_OR_DEPARTMENT_OTHER): Payer: Self-pay | Admitting: Surgery

## 2015-10-07 NOTE — Op Note (Signed)
NAME:  Joann Johnson, [name]                ACCOUNT NO.:  [id number]  MEDICAL RECORD NO.:  [id number]  PHYSICIAN:  Velora Hecklerodd M Daire Okimoto, MD      DATE OF BIRTH:  10/22/1960  DATE OF PROCEDURE:  10/06/2015                              OPERATIVE REPORT   PREOPERATIVE DIAGNOSIS:  Abnormal mammogram, right breast.  POSTOPERATIVE DIAGNOSIS:  Abnormal mammogram, right breast.  PROCEDURE:  Right breast excisional biopsy with wire localization.  SURGEON:  Velora Hecklerodd M Elis Rawlinson, M.D.  ANESTHESIA:  General.  ESTIMATED BLOOD LOSS:  Minimal.  PREPARATION:  ChloraPrep.  COMPLICATIONS:  None.  INDICATIONS:  The patient is a 55 year old female with abnormal right breast mammogram.  There is an area of architectural distortion in the right breast which is concerning to the radiologist.  The patient underwent needle biopsy, which was not felt to be concordant.  The patient desired surgical excision.  BODY OF REPORT:  [facility]  The patient was brought to the operating room, placed in supine position on the operating room table.  Following administration of general anesthesia, the patient was positioned and then prepped and draped in usual aseptic fashion.  After ascertaining that an adequate level of anesthesia had been achieved, the skin around the site of guidewire insertion in the upper right breast was anesthetized with local anesthetic.  A curvilinear incision was made at the site of the guidewire with a #15 blade.  Dissection was carried in the subcutaneous tissues using the electrocautery for hemostasis.  A core of breast tissue was then excised around the guidewire.  The guidewire was placed quite deeply and for quite a long length.  This required a relatively large biopsy to be taken.  Dissection was carried down to the chest wall and the pectoralis major muscle was visualized. Dissection was carried around the tip of the guidewire and the  entire guidewire and surrounding breast tissue was excised.  Specimen mammogram was obtained.  The lesion in question was identified by the radiologist and marked within the specimen.  The entire specimen was then submitted to Pathology for review.  Good hemostasis was obtained throughout the operative wound using the electrocautery.  Subcutaneous tissues were closed with interrupted 3-0 Vicryl sutures.  Skin was closed with a running 4-0 Monocryl subcuticular suture. Wound was washed and dried and benzoin and Steri-Strips were applied. Sterile dressings were applied.  A breast binder was applied.  The patient was awakened from anesthesia and brought to the recovery room. The patient tolerated the procedure well.   Velora Hecklerodd M. Melbourne Jakubiak, MD, [facility] Office: [phone]    TMG/MEDQ  D:  10/06/2015  T:  10/07/2015  Job:  [id number]

## 2015-10-12 NOTE — Progress Notes (Signed)
Please contact patient and notify of benign pathology results.  Anshu Wehner M. Feliciana Narayan, MD, FACS [facility] Office: [phone]   

## 2015-10-19 ENCOUNTER — Other Ambulatory Visit: Payer: Self-pay | Admitting: Gynecology

## 2015-10-19 NOTE — Telephone Encounter (Signed)
Rx called

## 2015-10-19 NOTE — Telephone Encounter (Signed)
Last filled on 02/15/15 with 2 refills

## 2015-11-14 ENCOUNTER — Ambulatory Visit: Payer: BLUE CROSS/BLUE SHIELD | Admitting: Gynecology

## 2015-11-14 ENCOUNTER — Other Ambulatory Visit: Payer: BLUE CROSS/BLUE SHIELD

## 2015-11-28 ENCOUNTER — Ambulatory Visit: Payer: BLUE CROSS/BLUE SHIELD | Admitting: Gynecology

## 2015-11-28 ENCOUNTER — Other Ambulatory Visit: Payer: BLUE CROSS/BLUE SHIELD

## 2015-12-01 ENCOUNTER — Encounter: Payer: Self-pay | Admitting: Gynecology

## 2015-12-01 ENCOUNTER — Ambulatory Visit (INDEPENDENT_AMBULATORY_CARE_PROVIDER_SITE_OTHER): Payer: BLUE CROSS/BLUE SHIELD

## 2015-12-01 ENCOUNTER — Ambulatory Visit (INDEPENDENT_AMBULATORY_CARE_PROVIDER_SITE_OTHER): Payer: BLUE CROSS/BLUE SHIELD | Admitting: Gynecology

## 2015-12-01 VITALS — BP 124/74

## 2015-12-01 DIAGNOSIS — N83202 Unspecified ovarian cyst, left side: Secondary | ICD-10-CM

## 2015-12-01 NOTE — Patient Instructions (Signed)
Let us know if you are interested in pursuing genetic counseling and we will refer you.

## 2015-12-01 NOTE — Progress Notes (Signed)
    Joann Johnson [facility] 1960/03/08 [id number]        55 y.o.  G0P0 presents with her cousin for follow up ultrasound. Does have family history of ovarian cancer in 2 older maternal aunts. Had recent CA-125 which was 8  And and ultrasound 08/2015 which showed a small echogenic area the left ovary with a small 5 x 6 mm echo-free cyst.   Past medical history,surgical history, problem list, medications, allergies, family history and social history were all reviewed and documented in the EPIC chart.  Directed ROS with pertinent positives and negatives documented in the history of present illness/assessment and plan.  Exam: Vitals:   12/01/15 1607  BP: 124/74   General appearance:  Normal  Transvaginal ultrasound shows uterus anteverted normal size and echotexture. Endometrial echo 2.8 mm. Right ovary normal. Left ovary with thin-walled echo-free cyst 6 x 7 mm. Previous echogenic focus not seen. Cul-de-sac negative.    Assessment/Plan:  55 y.o. G0P0 with small stable left ovarian cyst and normal CA-125. Family history of ovarian cancer in 2 elderly maternal aunts. Had previously discussed on several occasions BRCA genetic screening and the patient has always declined. I again discussed this option with her today. She asked about prophylactic surgery and I discussed with her what is involved with this. I think if she was going to proceed with this she really should do the genetic testing first to have a clear idea as to her risk. At this point the patient's not wanting to pursue genetic counseling. 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 Had questions about considering HRT through a bioequivalent clinic. I reviewed the whole issue of HRT to include pharmaceutical grade versus bioequivalent clinics. The issues of doing hormone level testing and treating the hormone level and not necessarily symptoms was also discussed. The issues of lack of studies addressing long-term safety and quality of the production also  reviewed. Reviewed the safety issues with HRT to include the most recent 2017 NAMS HRT guidelines. Benefits from a symptom relief as well as possible cardiovascular/bone health when initiated early discussed. Risks to include thrombosis such as stroke heart attack DVT and breast cancer also discussed. Patient's going to decide if she wants to pursue bioequivalent HRT or may return to further discuss standard HRT with me.      Anastasio Auerbach MD, 5:12 PM 12/01/2015

## 2015-12-02 ENCOUNTER — Telehealth: Payer: Self-pay | Admitting: *Deleted

## 2015-12-02 NOTE — Telephone Encounter (Signed)
-----   Message from Dara Lordsimothy P Fontaine, MD sent at 12/01/2015  5:20 PM EST ----- Patient and I discussed genetic counseling for breast cancer gene assessment. If she is interested in this [facility].

## 2015-12-02 NOTE — Telephone Encounter (Signed)
Pt said she is thinking about scheduling would prefer to wait to the beginning of the year and call me back if she chooses to proceed with referral.

## 2016-03-28 ENCOUNTER — Other Ambulatory Visit: Payer: Self-pay | Admitting: Surgery

## 2016-03-29 ENCOUNTER — Other Ambulatory Visit: Payer: Self-pay | Admitting: Surgery

## 2016-03-29 DIAGNOSIS — R928 Other abnormal and inconclusive findings on diagnostic imaging of breast: Secondary | ICD-10-CM

## 2016-04-04 ENCOUNTER — Ambulatory Visit
Admission: RE | Admit: 2016-04-04 | Discharge: 2016-04-04 | Disposition: A | Discharge status: Home / Self Care | Payer: BLUE CROSS/BLUE SHIELD | Source: Ambulatory Visit | Attending: Surgery | Admitting: Surgery

## 2016-04-04 ENCOUNTER — Other Ambulatory Visit: Payer: Self-pay | Admitting: Surgery

## 2016-04-04 DIAGNOSIS — N644 Mastodynia: Secondary | ICD-10-CM

## 2016-04-04 DIAGNOSIS — R928 Other abnormal and inconclusive findings on diagnostic imaging of breast: Secondary | ICD-10-CM

## 2016-04-04 DIAGNOSIS — R922 Inconclusive mammogram: Secondary | ICD-10-CM | POA: Diagnosis not present

## 2016-04-17 DIAGNOSIS — R0781 Pleurodynia: Secondary | ICD-10-CM | POA: Diagnosis not present

## 2016-04-17 DIAGNOSIS — Z9889 Other specified postprocedural states: Secondary | ICD-10-CM | POA: Diagnosis not present

## 2016-05-18 DIAGNOSIS — H47323 Drusen of optic disc, bilateral: Secondary | ICD-10-CM | POA: Diagnosis not present

## 2016-05-18 DIAGNOSIS — H40013 Open angle with borderline findings, low risk, bilateral: Secondary | ICD-10-CM | POA: Diagnosis not present

## 2016-05-18 DIAGNOSIS — H11153 Pinguecula, bilateral: Secondary | ICD-10-CM | POA: Diagnosis not present

## 2016-06-20 DIAGNOSIS — F419 Anxiety disorder, unspecified: Secondary | ICD-10-CM | POA: Diagnosis not present

## 2016-06-20 DIAGNOSIS — R197 Diarrhea, unspecified: Secondary | ICD-10-CM | POA: Diagnosis not present

## 2016-08-01 DIAGNOSIS — Z23 Encounter for immunization: Secondary | ICD-10-CM | POA: Diagnosis not present

## 2016-08-01 DIAGNOSIS — F419 Anxiety disorder, unspecified: Secondary | ICD-10-CM | POA: Diagnosis not present

## 2016-08-01 DIAGNOSIS — R7301 Impaired fasting glucose: Secondary | ICD-10-CM | POA: Diagnosis not present

## 2016-08-01 DIAGNOSIS — I10 Essential (primary) hypertension: Secondary | ICD-10-CM | POA: Diagnosis not present

## 2016-11-08 ENCOUNTER — Ambulatory Visit: Payer: BLUE CROSS/BLUE SHIELD | Admitting: Gynecology

## 2016-11-08 ENCOUNTER — Encounter: Payer: Self-pay | Admitting: Gynecology

## 2016-11-08 VITALS — BP 120/74 | Ht 63.0 in | Wt 147.0 lb

## 2016-11-08 DIAGNOSIS — N952 Postmenopausal atrophic vaginitis: Secondary | ICD-10-CM | POA: Diagnosis not present

## 2016-11-08 DIAGNOSIS — Z8041 Family history of malignant neoplasm of ovary: Secondary | ICD-10-CM | POA: Diagnosis not present

## 2016-11-08 DIAGNOSIS — Z01411 Encounter for gynecological examination (general) (routine) with abnormal findings: Secondary | ICD-10-CM

## 2016-11-08 DIAGNOSIS — M810 Age-related osteoporosis without current pathological fracture: Secondary | ICD-10-CM | POA: Diagnosis not present

## 2016-11-08 NOTE — Patient Instructions (Signed)
Follow-up for the ultrasound as scheduled. 

## 2016-11-08 NOTE — Progress Notes (Signed)
Joann FisherLisa D [place] Johnson 1960/11/16 [id number]        56 y.o.  G0P0 for annual gynecologic exam.  Several issues noted below.  Past medical history,surgical history, problem list, medications, allergies, family history and social history were all reviewed and documented as reviewed in the EPIC chart.  ROS:  Performed with pertinent positives and negatives included in the history, assessment and plan.   Additional significant findings : None   Exam: Joann PortelaKim Johnson assistant Vitals:   11/08/16 1528  BP: 120/74  Weight: 147 lb (66.7 kg)  Height: 5\' 3"  (1.6 m)   Body mass index is 26.04 kg/m.  General appearance:  Normal affect, orientation and appearance. Skin: Grossly normal HEENT: Without gross lesions.  No cervical or supraclavicular adenopathy. Thyroid normal.  Lungs:  Clear without wheezing, rales or rhonchi Cardiac: RR, without RMG Abdominal:  Soft, nontender, without masses, guarding, rebound, organomegaly or hernia Breasts:  Examined lying and sitting without masses, retractions, discharge or axillary adenopathy. Pelvic:  Ext, BUS, Vagina: With atrophic changes  Cervix: With atrophic changes  Uterus: Anteverted, normal size, shape and contour, midline and mobile nontender   Adnexa: Without masses or tenderness    Anus and perineum: Normal   Rectovaginal: Normal sphincter tone without palpated masses or tenderness.    Assessment/Plan:  56 y.o. G0P0 female for annual gynecologic exam.   1. Postmenopausal/atrophic genital changes/menopausal symptoms.  Patient having some hot flushes.  Questions about starting HRT.  Had used HRT a number of years ago but has been without it for patient reported 10 years.  I reviewed options with her and the risks/benefits of HRT.  Thrombosis risks to include stroke heart attack DVT as well as breast cancer issues reviewed.  Benefits as far as symptom relief, cardiovascular and bone health noting she does have osteoporosis also discussed.  At this  point the patient wants to try OTC products to see if that helps.  She may call if she wants to pursue HRT.  She is done no bleeding and knows to call if she does any bleeding. 2. Osteoporosis.  DEXA 08/2015 T score -2.9.  Had discussed treatment options with her.  Had transiently tried Fosamax but stopped this.  We have discussed alternatives to include Prolia.  She ultimately has declined treatment in the past and she again confirms that she does not want treatment understanding excepting the issues of osteoporosis and fracture risk.  She plans on weightbearing exercise and attention to calcium and vitamin D.  Has been low on vitamin D in the past.  Check vitamin D level today.  Plan repeat DEXA next year at 2-year interval. 3. Family history of ovarian cancer x2.  Also with a maternal cousin with breast cancer.  We discussed on multiple occasions genetic testing and she declines.  Has been doing ultrasounds with CA125's.  Ultrasound 11/2015 showed normal uterus with endometrial echo 2.8 mm.  Left ovary with thin-walled echo-free cyst 6 x 7 mm.  Patient is going to schedule ultrasound now and check her CA125. 4. Pap smear/HPV 2015.  No Pap smear done today.  No history of abnormal Pap smears.  Plan repeat Pap smear at 5-year interval per current screening guidelines. 5. Colonoscopy 2014.  Repeat at their recommended interval. 6. Health maintenance.  No routine lab work done as patient reports that Dr. Clarene DukeLittle was now doing her routine lab work to include following up on her hypercholesterolemia.  Follow-up for ultrasound and Ca125 results.  Follow-up in 1  year for annual exam.   Joann Johnson,Joann Pidgeon P MD, 4:08 PM 11/08/2016

## 2016-11-09 ENCOUNTER — Other Ambulatory Visit: Payer: Self-pay | Admitting: Gynecology

## 2016-11-09 DIAGNOSIS — E559 Vitamin D deficiency, unspecified: Secondary | ICD-10-CM

## 2016-11-09 LAB — CA 125: CA 125: 6 U/mL (ref <35)

## 2016-11-09 LAB — VITAMIN D 25 HYDROXY (VIT D DEFICIENCY, FRACTURES): VIT D 25 HYDROXY: 23 ng/mL — AB (ref 30–100)

## 2016-11-09 MED ORDER — VITAMIN D (ERGOCALCIFEROL) 1.25 MG (50000 UNIT) PO CAPS: 50000.0000 [IU] | ORAL_CAPSULE | ORAL | 0 refills | Status: DC

## 2016-11-26 ENCOUNTER — Other Ambulatory Visit: Payer: BLUE CROSS/BLUE SHIELD

## 2016-11-26 ENCOUNTER — Ambulatory Visit: Payer: BLUE CROSS/BLUE SHIELD | Admitting: Gynecology

## 2017-01-09 ENCOUNTER — Ambulatory Visit: Payer: BLUE CROSS/BLUE SHIELD | Admitting: Gynecology

## 2017-01-09 ENCOUNTER — Encounter: Payer: Self-pay | Admitting: Gynecology

## 2017-01-09 ENCOUNTER — Ambulatory Visit (INDEPENDENT_AMBULATORY_CARE_PROVIDER_SITE_OTHER): Payer: BLUE CROSS/BLUE SHIELD

## 2017-01-09 VITALS — BP 116/76

## 2017-01-09 DIAGNOSIS — Z8041 Family history of malignant neoplasm of ovary: Secondary | ICD-10-CM

## 2017-01-09 DIAGNOSIS — Z1321 Encounter for screening for nutritional disorder: Secondary | ICD-10-CM

## 2017-01-09 NOTE — Progress Notes (Signed)
    [facility] 10/17/1960 [id number]        57 y.o.  G0P0 presents for follow-up ultrasound.  Family history of ovarian cancer.  Recent CA 125 6.  Recent vitamin D 23.  Is on 50,000 units weekly over the past 2 months.    Past medical history,surgical history, problem list, medications, allergies, family history and social history were all reviewed and documented in the EPIC chart.  Directed ROS with pertinent positives and negatives documented in the history of present illness/assessment and plan.  Exam: Vitals:   01/09/17 1439  BP: 116/76   General appearance:  Normal  Ultrasound shows uterus normal size and echotexture.  Endometrial echo 2.3 mm.  Right and left ovaries visualized and normal.  Cul-de-sac negative.  Assessment/Plan:  57 y.o. G0P0 with family history of ovarian cancer.  Ultrasound negative.  CA 125 negative.  Continue expectant management.  Check MND level today.  If in the normal range then will plan 2000 units OTC vitamin D daily.  Follow-up when she is due for her annual exam this coming year.    Dara Lordsimothy P Valentine Kuechle MD, 3:03 PM 01/09/2017

## 2017-01-09 NOTE — Patient Instructions (Signed)
Follow-up for your annual exam when due 

## 2017-01-10 LAB — VITAMIN D 25 HYDROXY (VIT D DEFICIENCY, FRACTURES): Vit D, 25-Hydroxy: 31 ng/mL (ref 30–100)

## 2017-01-16 ENCOUNTER — Other Ambulatory Visit: Payer: Self-pay | Admitting: Gynecology

## 2017-01-16 DIAGNOSIS — E559 Vitamin D deficiency, unspecified: Secondary | ICD-10-CM

## 2017-01-16 MED ORDER — VITAMIN D (ERGOCALCIFEROL) 1.25 MG (50000 UNIT) PO CAPS: 50000.0000 [IU] | ORAL_CAPSULE | ORAL | 0 refills | Status: DC

## 2017-01-27 ENCOUNTER — Other Ambulatory Visit: Payer: Self-pay | Admitting: Gynecology

## 2017-01-28 NOTE — Telephone Encounter (Signed)
Last filled on 10/19/15 with 2 refills

## 2017-04-23 ENCOUNTER — Other Ambulatory Visit: Payer: Self-pay | Admitting: Surgery

## 2017-04-23 DIAGNOSIS — Z139 Encounter for screening, unspecified: Secondary | ICD-10-CM

## 2017-04-25 ENCOUNTER — Ambulatory Visit
Admission: RE | Admit: 2017-04-25 | Discharge: 2017-04-25 | Disposition: A | Discharge status: Home / Self Care | Payer: BLUE CROSS/BLUE SHIELD | Source: Ambulatory Visit | Attending: Surgery | Admitting: Surgery

## 2017-04-25 DIAGNOSIS — Z139 Encounter for screening, unspecified: Secondary | ICD-10-CM

## 2017-04-25 DIAGNOSIS — Z1231 Encounter for screening mammogram for malignant neoplasm of breast: Secondary | ICD-10-CM | POA: Diagnosis not present

## 2017-06-10 DIAGNOSIS — H2513 Age-related nuclear cataract, bilateral: Secondary | ICD-10-CM | POA: Diagnosis not present

## 2017-06-10 DIAGNOSIS — H40013 Open angle with borderline findings, low risk, bilateral: Secondary | ICD-10-CM | POA: Diagnosis not present

## 2017-07-11 ENCOUNTER — Other Ambulatory Visit: Payer: Self-pay | Admitting: Gynecology

## 2017-09-13 DIAGNOSIS — J0100 Acute maxillary sinusitis, unspecified: Secondary | ICD-10-CM | POA: Diagnosis not present

## 2017-11-13 ENCOUNTER — Ambulatory Visit (INDEPENDENT_AMBULATORY_CARE_PROVIDER_SITE_OTHER): Payer: BLUE CROSS/BLUE SHIELD | Admitting: Gynecology

## 2017-11-13 ENCOUNTER — Encounter: Payer: Self-pay | Admitting: Gynecology

## 2017-11-13 VITALS — BP 120/74 | Ht 62.0 in | Wt 145.0 lb

## 2017-11-13 DIAGNOSIS — Z01419 Encounter for gynecological examination (general) (routine) without abnormal findings: Secondary | ICD-10-CM | POA: Diagnosis not present

## 2017-11-13 DIAGNOSIS — Z8041 Family history of malignant neoplasm of ovary: Secondary | ICD-10-CM | POA: Diagnosis not present

## 2017-11-13 DIAGNOSIS — Z13220 Encounter for screening for lipoid disorders: Secondary | ICD-10-CM

## 2017-11-13 DIAGNOSIS — M810 Age-related osteoporosis without current pathological fracture: Secondary | ICD-10-CM

## 2017-11-13 NOTE — Progress Notes (Signed)
    Belinda FisherLisa D [facility] [birth year] [id number]        57 y.o.  G0P0 for annual gynecologic exam.  Several issues noted below.  Past medical history,surgical history, problem list, medications, allergies, family history and social history were all reviewed and documented as reviewed in the EPIC chart.  ROS:  Performed with pertinent positives and negatives included in the history, assessment and plan.   Additional significant findings : None   Exam: Kennon PortelaKim Gardner assistant Vitals:   11/13/17 1542  BP: 120/74  Weight: 145 lb (65.8 kg)  Height: 5\' 2"  (1.575 m)   Body mass index is 26.52 kg/m.  General appearance:  Normal affect, orientation and appearance. Skin: Grossly normal HEENT: Without gross lesions.  No cervical or supraclavicular adenopathy. Thyroid normal.  Lungs:  Clear without wheezing, rales or rhonchi Cardiac: RR, without RMG Abdominal:  Soft, nontender, without masses, guarding, rebound, organomegaly or hernia Breasts:  Examined lying and sitting without masses, retractions, discharge or axillary adenopathy. Pelvic:  Ext, BUS, Vagina: Normal with atrophic changes  Cervix: Normal with atrophic changes.  Pap smear done  Uterus: Anteverted, normal size, shape and contour, midline and mobile nontender   Adnexa: Without masses or tenderness    Anus and perineum: Normal   Rectovaginal: Normal sphincter tone without palpated masses or tenderness.    Assessment/Plan:  57 y.o. G0P0 female for annual gynecologic exam.   1. Postmenopausal.  No significant menopausal symptoms or any vaginal bleeding. 2. Osteoporosis.  DEXA 2017 T score -2.9.  We have discussed treatment options in the past and she is declined.  Recommended she schedule DEXA now at 2-year interval.  Check vitamin D level. 3. Family history of ovarian cancer x2 with maternal cousin with breast cancer.  We have discussed options multiple times to include genetic counseling and testing which she has declined.  CA 125 and  ultrasound screening recognizing the pitfalls of false negative and false positive up to and including prophylactic surgeries.  At this point the patient again would prefer to continue with annual ultrasound and CA 125 monitoring.  Will check CA 125 now and she will schedule ultrasound and follow-up. 4. Pap smear/HPV 2015.  Pap smear done today.  No history of abnormal Pap smears previously. 5. Colonoscopy 2015.  Repeat at their recommended interval. 6. Health maintenance.  Patient asked about doing routine lab work.  Future orders placed for CBC, CMP, lipid profile, CA 125, vitamin D.  Follow-up for ultrasound, bone density and lab work results.   Dara Lordsimothy P Ronya Gilcrest MD, 4:32 PM 11/13/2017

## 2017-11-13 NOTE — Patient Instructions (Signed)
Follow-up for fasting blood work as arranged.   Follow-up for the bone density as scheduled.  Follow-up for the ultrasound as scheduled

## 2017-11-15 LAB — PAP IG W/ RFLX HPV ASCU

## 2017-11-18 DIAGNOSIS — R262 Difficulty in walking, not elsewhere classified: Secondary | ICD-10-CM | POA: Diagnosis not present

## 2017-11-18 DIAGNOSIS — M25552 Pain in left hip: Secondary | ICD-10-CM | POA: Diagnosis not present

## 2017-11-19 DIAGNOSIS — R262 Difficulty in walking, not elsewhere classified: Secondary | ICD-10-CM | POA: Diagnosis not present

## 2017-11-19 DIAGNOSIS — M25552 Pain in left hip: Secondary | ICD-10-CM | POA: Diagnosis not present

## 2017-11-27 DIAGNOSIS — M25552 Pain in left hip: Secondary | ICD-10-CM | POA: Diagnosis not present

## 2017-11-27 DIAGNOSIS — R262 Difficulty in walking, not elsewhere classified: Secondary | ICD-10-CM | POA: Diagnosis not present

## 2017-12-18 ENCOUNTER — Other Ambulatory Visit: Payer: BLUE CROSS/BLUE SHIELD

## 2018-01-06 DIAGNOSIS — K921 Melena: Secondary | ICD-10-CM | POA: Diagnosis not present

## 2018-01-14 ENCOUNTER — Other Ambulatory Visit: Payer: Self-pay | Admitting: *Deleted

## 2018-01-14 ENCOUNTER — Other Ambulatory Visit: Payer: BLUE CROSS/BLUE SHIELD

## 2018-01-14 ENCOUNTER — Other Ambulatory Visit: Payer: Self-pay | Admitting: Gynecology

## 2018-01-14 ENCOUNTER — Ambulatory Visit: Payer: BLUE CROSS/BLUE SHIELD | Admitting: Gynecology

## 2018-01-14 DIAGNOSIS — Z8041 Family history of malignant neoplasm of ovary: Secondary | ICD-10-CM

## 2018-01-16 ENCOUNTER — Ambulatory Visit (INDEPENDENT_AMBULATORY_CARE_PROVIDER_SITE_OTHER): Payer: BLUE CROSS/BLUE SHIELD

## 2018-01-16 ENCOUNTER — Encounter: Payer: Self-pay | Admitting: Gynecology

## 2018-01-16 ENCOUNTER — Other Ambulatory Visit: Payer: Self-pay | Admitting: Gynecology

## 2018-01-16 ENCOUNTER — Other Ambulatory Visit: Payer: BLUE CROSS/BLUE SHIELD

## 2018-01-16 ENCOUNTER — Telehealth: Payer: Self-pay | Admitting: Gynecology

## 2018-01-16 DIAGNOSIS — M810 Age-related osteoporosis without current pathological fracture: Secondary | ICD-10-CM | POA: Diagnosis not present

## 2018-01-16 NOTE — Telephone Encounter (Signed)
Tell patient that her current bone density is stable from her prior study.  It continues to show osteoporosis although does not show significant ongoing bone loss.  This still puts her at an increased risk of fracture.  We discussed options for treatment in the past which she has declined.  If she is interested in re-discussing treatment options and recommend office visit.

## 2018-01-17 NOTE — Telephone Encounter (Signed)
Patient informed. 

## 2018-02-06 ENCOUNTER — Ambulatory Visit: Payer: BLUE CROSS/BLUE SHIELD | Admitting: Gynecology

## 2018-02-06 ENCOUNTER — Other Ambulatory Visit: Payer: BLUE CROSS/BLUE SHIELD

## 2018-02-20 ENCOUNTER — Ambulatory Visit: Payer: BLUE CROSS/BLUE SHIELD | Admitting: Gynecology

## 2018-02-20 ENCOUNTER — Other Ambulatory Visit: Payer: BLUE CROSS/BLUE SHIELD

## 2018-02-27 ENCOUNTER — Other Ambulatory Visit: Payer: BLUE CROSS/BLUE SHIELD

## 2018-02-27 ENCOUNTER — Ambulatory Visit: Payer: BLUE CROSS/BLUE SHIELD | Admitting: Gynecology

## 2018-04-02 DIAGNOSIS — I10 Essential (primary) hypertension: Secondary | ICD-10-CM | POA: Diagnosis not present

## 2018-04-02 DIAGNOSIS — R7301 Impaired fasting glucose: Secondary | ICD-10-CM | POA: Diagnosis not present

## 2018-04-02 DIAGNOSIS — E782 Mixed hyperlipidemia: Secondary | ICD-10-CM | POA: Diagnosis not present

## 2018-04-02 DIAGNOSIS — D7589 Other specified diseases of blood and blood-forming organs: Secondary | ICD-10-CM | POA: Diagnosis not present

## 2018-04-16 DIAGNOSIS — R7301 Impaired fasting glucose: Secondary | ICD-10-CM | POA: Diagnosis not present

## 2018-04-16 DIAGNOSIS — I10 Essential (primary) hypertension: Secondary | ICD-10-CM | POA: Diagnosis not present

## 2018-04-16 DIAGNOSIS — E782 Mixed hyperlipidemia: Secondary | ICD-10-CM | POA: Diagnosis not present

## 2018-05-31 DIAGNOSIS — U071 COVID-19: Secondary | ICD-10-CM | POA: Diagnosis not present

## 2018-05-31 DIAGNOSIS — Z20828 Contact with and (suspected) exposure to other viral communicable diseases: Secondary | ICD-10-CM | POA: Diagnosis not present

## 2018-06-27 DIAGNOSIS — I10 Essential (primary) hypertension: Secondary | ICD-10-CM | POA: Diagnosis not present

## 2018-06-27 DIAGNOSIS — Z733 Stress, not elsewhere classified: Secondary | ICD-10-CM | POA: Diagnosis not present

## 2018-07-17 ENCOUNTER — Other Ambulatory Visit: Payer: Self-pay | Admitting: Gynecology

## 2018-07-17 DIAGNOSIS — I10 Essential (primary) hypertension: Secondary | ICD-10-CM | POA: Diagnosis not present

## 2018-07-17 DIAGNOSIS — F39 Unspecified mood [affective] disorder: Secondary | ICD-10-CM | POA: Diagnosis not present

## 2018-07-19 ENCOUNTER — Other Ambulatory Visit: Payer: Self-pay | Admitting: Gynecology

## 2018-07-21 NOTE — Telephone Encounter (Signed)
This was approved on 07/17/18 but encounter was not routed back for Rx to be called in. Called in today.

## 2018-08-05 DIAGNOSIS — I10 Essential (primary) hypertension: Secondary | ICD-10-CM | POA: Diagnosis not present

## 2018-09-07 ENCOUNTER — Emergency Department (HOSPITAL_COMMUNITY): Payer: BC Managed Care – PPO

## 2018-09-07 ENCOUNTER — Encounter (HOSPITAL_COMMUNITY): Payer: Self-pay

## 2018-09-07 ENCOUNTER — Inpatient Hospital Stay (HOSPITAL_COMMUNITY)
Admission: EM | Admit: 2018-09-07 | Discharge: 2018-10-02 | DRG: 020 | Disposition: E | Payer: BC Managed Care – PPO | Attending: Neurosurgery | Admitting: Neurosurgery

## 2018-09-07 DIAGNOSIS — H4902 Third [oculomotor] nerve palsy, left eye: Secondary | ICD-10-CM | POA: Diagnosis present

## 2018-09-07 DIAGNOSIS — R001 Bradycardia, unspecified: Secondary | ICD-10-CM | POA: Diagnosis not present

## 2018-09-07 DIAGNOSIS — J9601 Acute respiratory failure with hypoxia: Secondary | ICD-10-CM | POA: Diagnosis present

## 2018-09-07 DIAGNOSIS — R739 Hyperglycemia, unspecified: Secondary | ICD-10-CM | POA: Diagnosis not present

## 2018-09-07 DIAGNOSIS — R402312 Coma scale, best motor response, none, at arrival to emergency department: Secondary | ICD-10-CM | POA: Diagnosis present

## 2018-09-07 DIAGNOSIS — J690 Pneumonitis due to inhalation of food and vomit: Secondary | ICD-10-CM | POA: Diagnosis not present

## 2018-09-07 DIAGNOSIS — Z005 Encounter for examination of potential donor of organ and tissue: Secondary | ICD-10-CM | POA: Diagnosis not present

## 2018-09-07 DIAGNOSIS — Z833 Family history of diabetes mellitus: Secondary | ICD-10-CM

## 2018-09-07 DIAGNOSIS — Z8249 Family history of ischemic heart disease and other diseases of the circulatory system: Secondary | ICD-10-CM

## 2018-09-07 DIAGNOSIS — G919 Hydrocephalus, unspecified: Secondary | ICD-10-CM | POA: Diagnosis present

## 2018-09-07 DIAGNOSIS — I10 Essential (primary) hypertension: Secondary | ICD-10-CM | POA: Diagnosis not present

## 2018-09-07 DIAGNOSIS — R6521 Severe sepsis with septic shock: Secondary | ICD-10-CM | POA: Diagnosis not present

## 2018-09-07 DIAGNOSIS — R402212 Coma scale, best verbal response, none, at arrival to emergency department: Secondary | ICD-10-CM | POA: Diagnosis present

## 2018-09-07 DIAGNOSIS — K828 Other specified diseases of gallbladder: Secondary | ICD-10-CM | POA: Diagnosis not present

## 2018-09-07 DIAGNOSIS — D72829 Elevated white blood cell count, unspecified: Secondary | ICD-10-CM | POA: Diagnosis not present

## 2018-09-07 DIAGNOSIS — I67848 Other cerebrovascular vasospasm and vasoconstriction: Secondary | ICD-10-CM

## 2018-09-07 DIAGNOSIS — J181 Lobar pneumonia, unspecified organism: Secondary | ICD-10-CM | POA: Diagnosis not present

## 2018-09-07 DIAGNOSIS — G914 Hydrocephalus in diseases classified elsewhere: Secondary | ICD-10-CM | POA: Diagnosis not present

## 2018-09-07 DIAGNOSIS — J9600 Acute respiratory failure, unspecified whether with hypoxia or hypercapnia: Secondary | ICD-10-CM | POA: Diagnosis not present

## 2018-09-07 DIAGNOSIS — E7800 Pure hypercholesterolemia, unspecified: Secondary | ICD-10-CM | POA: Diagnosis not present

## 2018-09-07 DIAGNOSIS — J80 Acute respiratory distress syndrome: Secondary | ICD-10-CM | POA: Diagnosis not present

## 2018-09-07 DIAGNOSIS — I608 Other nontraumatic subarachnoid hemorrhage: Secondary | ICD-10-CM | POA: Diagnosis not present

## 2018-09-07 DIAGNOSIS — R29737 NIHSS score 37: Secondary | ICD-10-CM | POA: Diagnosis present

## 2018-09-07 DIAGNOSIS — T380X5A Adverse effect of glucocorticoids and synthetic analogues, initial encounter: Secondary | ICD-10-CM | POA: Diagnosis not present

## 2018-09-07 DIAGNOSIS — I619 Nontraumatic intracerebral hemorrhage, unspecified: Secondary | ICD-10-CM | POA: Diagnosis not present

## 2018-09-07 DIAGNOSIS — R579 Shock, unspecified: Secondary | ICD-10-CM

## 2018-09-07 DIAGNOSIS — M810 Age-related osteoporosis without current pathological fracture: Secondary | ICD-10-CM | POA: Diagnosis present

## 2018-09-07 DIAGNOSIS — R40243 Glasgow coma scale score 3-8, unspecified time: Secondary | ICD-10-CM | POA: Diagnosis present

## 2018-09-07 DIAGNOSIS — J90 Pleural effusion, not elsewhere classified: Secondary | ICD-10-CM | POA: Diagnosis not present

## 2018-09-07 DIAGNOSIS — S066X0A Traumatic subarachnoid hemorrhage without loss of consciousness, initial encounter: Secondary | ICD-10-CM | POA: Diagnosis not present

## 2018-09-07 DIAGNOSIS — R402112 Coma scale, eyes open, never, at arrival to emergency department: Secondary | ICD-10-CM | POA: Diagnosis not present

## 2018-09-07 DIAGNOSIS — I604 Nontraumatic subarachnoid hemorrhage from basilar artery: Principal | ICD-10-CM | POA: Diagnosis present

## 2018-09-07 DIAGNOSIS — Z79899 Other long term (current) drug therapy: Secondary | ICD-10-CM

## 2018-09-07 DIAGNOSIS — Z66 Do not resuscitate: Secondary | ICD-10-CM | POA: Diagnosis not present

## 2018-09-07 DIAGNOSIS — Z800 Family history of malignant neoplasm of digestive organs: Secondary | ICD-10-CM

## 2018-09-07 DIAGNOSIS — G9341 Metabolic encephalopathy: Secondary | ICD-10-CM | POA: Diagnosis not present

## 2018-09-07 DIAGNOSIS — I609 Nontraumatic subarachnoid hemorrhage, unspecified: Secondary | ICD-10-CM

## 2018-09-07 DIAGNOSIS — J9690 Respiratory failure, unspecified, unspecified whether with hypoxia or hypercapnia: Secondary | ICD-10-CM | POA: Diagnosis not present

## 2018-09-07 DIAGNOSIS — I671 Cerebral aneurysm, nonruptured: Secondary | ICD-10-CM | POA: Diagnosis not present

## 2018-09-07 DIAGNOSIS — Z524 Kidney donor: Secondary | ICD-10-CM

## 2018-09-07 DIAGNOSIS — A419 Sepsis, unspecified organism: Secondary | ICD-10-CM | POA: Diagnosis not present

## 2018-09-07 DIAGNOSIS — Z20828 Contact with and (suspected) exposure to other viral communicable diseases: Secondary | ICD-10-CM | POA: Diagnosis not present

## 2018-09-07 DIAGNOSIS — I639 Cerebral infarction, unspecified: Secondary | ICD-10-CM | POA: Diagnosis not present

## 2018-09-07 DIAGNOSIS — R0902 Hypoxemia: Secondary | ICD-10-CM | POA: Diagnosis not present

## 2018-09-07 DIAGNOSIS — E876 Hypokalemia: Secondary | ICD-10-CM | POA: Diagnosis present

## 2018-09-07 DIAGNOSIS — Z823 Family history of stroke: Secondary | ICD-10-CM

## 2018-09-07 DIAGNOSIS — E872 Acidosis: Secondary | ICD-10-CM | POA: Diagnosis not present

## 2018-09-07 DIAGNOSIS — S299XXA Unspecified injury of thorax, initial encounter: Secondary | ICD-10-CM | POA: Diagnosis not present

## 2018-09-07 DIAGNOSIS — I651 Occlusion and stenosis of basilar artery: Secondary | ICD-10-CM | POA: Diagnosis present

## 2018-09-07 DIAGNOSIS — E785 Hyperlipidemia, unspecified: Secondary | ICD-10-CM | POA: Diagnosis present

## 2018-09-07 DIAGNOSIS — Z4659 Encounter for fitting and adjustment of other gastrointestinal appliance and device: Secondary | ICD-10-CM

## 2018-09-07 DIAGNOSIS — I959 Hypotension, unspecified: Secondary | ICD-10-CM | POA: Diagnosis present

## 2018-09-07 DIAGNOSIS — Z419 Encounter for procedure for purposes other than remedying health state, unspecified: Secondary | ICD-10-CM

## 2018-09-07 DIAGNOSIS — N179 Acute kidney failure, unspecified: Secondary | ICD-10-CM | POA: Diagnosis present

## 2018-09-07 DIAGNOSIS — Z8041 Family history of malignant neoplasm of ovary: Secondary | ICD-10-CM

## 2018-09-07 DIAGNOSIS — R4182 Altered mental status, unspecified: Secondary | ICD-10-CM | POA: Diagnosis not present

## 2018-09-07 DIAGNOSIS — J984 Other disorders of lung: Secondary | ICD-10-CM | POA: Diagnosis not present

## 2018-09-07 DIAGNOSIS — Z03818 Encounter for observation for suspected exposure to other biological agents ruled out: Secondary | ICD-10-CM | POA: Diagnosis not present

## 2018-09-07 LAB — DIFFERENTIAL
Abs Immature Granulocytes: 0.25 10*3/uL — ABNORMAL HIGH (ref 0.00–0.07)
Basophils Absolute: 0.2 10*3/uL — ABNORMAL HIGH (ref 0.0–0.1)
Basophils Relative: 1 %
Eosinophils Absolute: 0 10*3/uL (ref 0.0–0.5)
Eosinophils Relative: 0 %
Immature Granulocytes: 1 %
Lymphocytes Relative: 9 %
Lymphs Abs: 2.8 10*3/uL (ref 0.7–4.0)
Monocytes Absolute: 2.7 10*3/uL — ABNORMAL HIGH (ref 0.1–1.0)
Monocytes Relative: 9 %
Neutro Abs: 25.7 10*3/uL — ABNORMAL HIGH (ref 1.7–7.7)
Neutrophils Relative %: 80 %

## 2018-09-07 LAB — CBC
HCT: 46.7 % — ABNORMAL HIGH (ref 36.0–46.0)
Hemoglobin: 14.5 g/dL (ref 12.0–15.0)
MCH: 33.9 pg (ref 26.0–34.0)
MCHC: 31 g/dL (ref 30.0–36.0)
MCV: 109.1 fL — ABNORMAL HIGH (ref 80.0–100.0)
Platelets: 298 10*3/uL (ref 150–400)
RBC: 4.28 MIL/uL (ref 3.87–5.11)
RDW: 12.5 % (ref 11.5–15.5)
WBC: 31.6 10*3/uL — ABNORMAL HIGH (ref 4.0–10.5)
nRBC: 0 % (ref 0.0–0.2)

## 2018-09-07 LAB — POCT I-STAT 7, (LYTES, BLD GAS, ICA,H+H)
Acid-base deficit: 13 mmol/L — ABNORMAL HIGH (ref 0.0–2.0)
Bicarbonate: 16.1 mmol/L — ABNORMAL LOW (ref 20.0–28.0)
Calcium, Ion: 1.16 mmol/L (ref 1.15–1.40)
HCT: 43 % (ref 36.0–46.0)
Hemoglobin: 14.6 g/dL (ref 12.0–15.0)
O2 Saturation: 86 %
Patient temperature: 99.1
Potassium: 3.3 mmol/L — ABNORMAL LOW (ref 3.5–5.1)
Sodium: 141 mmol/L (ref 135–145)
TCO2: 17 mmol/L — ABNORMAL LOW (ref 22–32)
pCO2 arterial: 46.9 mmHg (ref 32.0–48.0)
pH, Arterial: 7.145 — CL (ref 7.350–7.450)
pO2, Arterial: 68 mmHg — ABNORMAL LOW (ref 83.0–108.0)

## 2018-09-07 LAB — I-STAT BETA HCG BLOOD, ED (MC, WL, AP ONLY): I-stat hCG, quantitative: <5 m[IU]/mL (ref <5)

## 2018-09-07 LAB — COMPREHENSIVE METABOLIC PANEL
ALT: 26 U/L (ref 0–44)
AST: 58 U/L — ABNORMAL HIGH (ref 15–41)
Albumin: 3.2 g/dL — ABNORMAL LOW (ref 3.5–5.0)
Alkaline Phosphatase: 60 U/L (ref 38–126)
Anion gap: 22 — ABNORMAL HIGH (ref 5–15)
BUN: 19 mg/dL (ref 6–20)
CO2: 11 mmol/L — ABNORMAL LOW (ref 22–32)
Calcium: 7.9 mg/dL — ABNORMAL LOW (ref 8.9–10.3)
Chloride: 101 mmol/L (ref 98–111)
Creatinine, Ser: 1.55 mg/dL — ABNORMAL HIGH (ref 0.44–1.00)
GFR calc Af Amer: 42 mL/min — ABNORMAL LOW (ref >60)
GFR calc non Af Amer: 36 mL/min — ABNORMAL LOW (ref >60)
Glucose, Bld: 380 mg/dL — ABNORMAL HIGH (ref 70–99)
Potassium: 4.5 mmol/L (ref 3.5–5.1)
Sodium: 134 mmol/L — ABNORMAL LOW (ref 135–145)
Total Bilirubin: 1.2 mg/dL (ref 0.3–1.2)
Total Protein: 5.9 g/dL — ABNORMAL LOW (ref 6.5–8.1)

## 2018-09-07 LAB — HEMOGLOBIN A1C
Hgb A1c MFr Bld: 6.2 % — ABNORMAL HIGH (ref 4.8–5.6)
Mean Plasma Glucose: 131.24 mg/dL

## 2018-09-07 LAB — I-STAT CHEM 8, ED
BUN: 22 mg/dL — ABNORMAL HIGH (ref 6–20)
Calcium, Ion: 1.07 mmol/L — ABNORMAL LOW (ref 1.15–1.40)
Chloride: 106 mmol/L (ref 98–111)
Creatinine, Ser: 1.3 mg/dL — ABNORMAL HIGH (ref 0.44–1.00)
Glucose, Bld: 406 mg/dL — ABNORMAL HIGH (ref 70–99)
HCT: 48 % — ABNORMAL HIGH (ref 36.0–46.0)
Hemoglobin: 16.3 g/dL — ABNORMAL HIGH (ref 12.0–15.0)
Potassium: 3.9 mmol/L (ref 3.5–5.1)
Sodium: 138 mmol/L (ref 135–145)
TCO2: 15 mmol/L — ABNORMAL LOW (ref 22–32)

## 2018-09-07 LAB — PROTIME-INR
INR: 1.3 — ABNORMAL HIGH (ref 0.8–1.2)
Prothrombin Time: 15.5 seconds — ABNORMAL HIGH (ref 11.4–15.2)

## 2018-09-07 LAB — CBG MONITORING, ED
Glucose-Capillary: 164 mg/dL — ABNORMAL HIGH (ref 70–99)
Glucose-Capillary: 367 mg/dL — ABNORMAL HIGH (ref 70–99)

## 2018-09-07 LAB — APTT: aPTT: 24 seconds (ref 24–36)

## 2018-09-07 LAB — SARS CORONAVIRUS 2 BY RT PCR (HOSPITAL ORDER, PERFORMED IN ~~LOC~~ HOSPITAL LAB): SARS Coronavirus 2: NEGATIVE

## 2018-09-07 LAB — LACTIC ACID, PLASMA: Lactic Acid, Venous: >11 mmol/L (ref 0.5–1.9)

## 2018-09-07 MED ORDER — VERAPAMIL HCL 2.5 MG/ML IV SOLN
INTRAVENOUS | Status: AC
  Filled 2018-09-07: qty 2

## 2018-09-07 MED ORDER — SODIUM CHLORIDE 0.9 % IV SOLN
Freq: Once | INTRAVENOUS | Status: AC
  Administered 2018-09-07: 21:00:00 via INTRAVENOUS

## 2018-09-07 MED ORDER — FENTANYL CITRATE (PF) 100 MCG/2ML IJ SOLN
INTRAMUSCULAR | Status: AC | PRN
  Administered 2018-09-07: 50 ug via INTRAVENOUS

## 2018-09-07 MED ORDER — PANTOPRAZOLE SODIUM 40 MG IV SOLR
40.0000 mg | Freq: Every day | INTRAVENOUS | Status: DC
  Administered 2018-09-07 – 2018-09-08 (×2): 40 mg via INTRAVENOUS
  Filled 2018-09-07 (×2): qty 40

## 2018-09-07 MED ORDER — FENTANYL CITRATE (PF) 100 MCG/2ML IJ SOLN
INTRAMUSCULAR | Status: AC
  Filled 2018-09-07: qty 2

## 2018-09-07 MED ORDER — MORPHINE SULFATE (PF) 2 MG/ML IV SOLN: 2.0000 mg | INTRAVENOUS | Status: DC | PRN

## 2018-09-07 MED ORDER — ACETAMINOPHEN 325 MG PO TABS: 650.0000 mg | ORAL_TABLET | ORAL | Status: DC | PRN

## 2018-09-07 MED ORDER — DEXAMETHASONE SODIUM PHOSPHATE 10 MG/ML IJ SOLN
10.0000 mg | Freq: Four times a day (QID) | INTRAMUSCULAR | Status: DC
  Administered 2018-09-07 – 2018-09-12 (×19): 10 mg via INTRAVENOUS
  Filled 2018-09-07 (×19): qty 1

## 2018-09-07 MED ORDER — ONDANSETRON HCL 4 MG/2ML IJ SOLN: 4.0000 mg | Freq: Four times a day (QID) | INTRAMUSCULAR | Status: DC | PRN

## 2018-09-07 MED ORDER — NIMODIPINE 30 MG PO CAPS
60.0000 mg | ORAL_CAPSULE | ORAL | Status: DC
  Filled 2018-09-07 (×4): qty 2

## 2018-09-07 MED ORDER — ETOMIDATE 2 MG/ML IV SOLN
INTRAVENOUS | Status: AC | PRN
  Administered 2018-09-07: 20 mg via INTRAVENOUS

## 2018-09-07 MED ORDER — SODIUM CHLORIDE 0.9% FLUSH
3.0000 mL | Freq: Once | INTRAVENOUS | Status: AC
Start: 2018-09-07 — End: 2018-09-07
  Administered 2018-09-07: 3 mL via INTRAVENOUS

## 2018-09-07 MED ORDER — SUCCINYLCHOLINE CHLORIDE 20 MG/ML IJ SOLN
INTRAMUSCULAR | Status: AC | PRN
  Administered 2018-09-07: 100 mg via INTRAVENOUS

## 2018-09-07 MED ORDER — FENTANYL 2500MCG IN NS 250ML (10MCG/ML) PREMIX INFUSION
0.0000 ug/h | INTRAVENOUS | Status: DC
  Administered 2018-09-07: 25 ug/h via INTRAVENOUS
  Administered 2018-09-08: 10:00:00 400 ug/h via INTRAVENOUS
  Administered 2018-09-08 – 2018-09-09 (×2): 350 ug/h via INTRAVENOUS
  Administered 2018-09-09: 20:00:00 250 ug/h via INTRAVENOUS
  Administered 2018-09-09: 11:00:00 300 ug/h via INTRAVENOUS
  Administered 2018-09-10: 250 ug/h via INTRAVENOUS
  Administered 2018-09-10 – 2018-09-11 (×2): 200 ug/h via INTRAVENOUS
  Administered 2018-09-11 – 2018-09-12 (×2): 50 ug/h via INTRAVENOUS
  Filled 2018-09-07 (×11): qty 250

## 2018-09-07 MED ORDER — MIDAZOLAM HCL 2 MG/2ML IJ SOLN
INTRAMUSCULAR | Status: AC
  Filled 2018-09-07: qty 6

## 2018-09-07 MED ORDER — INSULIN ASPART 100 UNIT/ML ~~LOC~~ SOLN
0.0000 [IU] | SUBCUTANEOUS | Status: DC
  Administered 2018-09-07 – 2018-09-08 (×4): 3 [IU] via SUBCUTANEOUS
  Administered 2018-09-08: 2 [IU] via SUBCUTANEOUS
  Administered 2018-09-08 – 2018-09-09 (×3): 3 [IU] via SUBCUTANEOUS
  Administered 2018-09-09: 5 [IU] via SUBCUTANEOUS

## 2018-09-07 MED ORDER — MIDAZOLAM HCL 5 MG/5ML IJ SOLN
INTRAMUSCULAR | Status: AC | PRN
  Administered 2018-09-07: 5 mg via INTRAVENOUS

## 2018-09-07 MED ORDER — PIPERACILLIN-TAZOBACTAM 3.375 G IVPB 30 MIN
3.3750 g | Freq: Once | INTRAVENOUS | Status: AC
  Administered 2018-09-07: 3.375 g via INTRAVENOUS
  Filled 2018-09-07: qty 50

## 2018-09-07 MED ORDER — LABETALOL HCL 5 MG/ML IV SOLN: 10.0000 mg | INTRAVENOUS | Status: DC | PRN

## 2018-09-07 NOTE — ED Notes (Signed)
X-Ray called for verification of ET tube placement three times.

## 2018-09-07 NOTE — ED Notes (Signed)
ED TO INPATIENT HANDOFF REPORT  ED Nurse Name and Phone #:  Clydene Laming [phone]  S Name/Age/Gender Joann Johnson 58 y.o. female Room/Bed: TRACC/TRACC  Code Status   Code Status: Not on file  Home/SNF/Other Home   Triage Complete: Triage complete  Chief Complaint Unresponsive; Blown Pupils  Triage Note Patient is brought to the ER from EMS with an altered mental status. Patient presents with blown pupil on the left side and is breathing irregularly. Patient was found by the family under the bed. Last known normal this am. Heart rate in the 120s and 1 liter given by EMS   Allergies No Known Allergies  Level of Care/Admitting Diagnosis ED Disposition    None      B Medical/Surgery History Past Medical History:  Diagnosis Date  . Hypercholesteremia   . Hypertension   . Osteoporosis 01/2018   T score -2.7 stable from prior DEXA  . Thrombosis    superficial thrombosis inankle on OCP as a young adult    Past Surgical History:  Procedure Laterality Date  . BREAST BIOPSY Right 10/06/2015   Procedure: RIGHT BREAST BIOPSY WITH NEEDLE LOCALIZATION;  Surgeon: Armandina Gemma, MD;  Location: [place];  Service: General;  Laterality: Right;  . BREAST EXCISIONAL BIOPSY Right 10/06/2015  . LAPAROSCOPY  1988   surgery by Dr, Olena Mater  . thumb surgery       A IV Location/Drains/Wounds Patient Lines/Drains/Airways Status   Active Line/Drains/Airways    Name:   Placement date:   Placement time:   Site:   Days:   Peripheral IV Sep 15, 2018 Left Antecubital   15-Sep-2018    -    Antecubital   less than 1   Peripheral IV Sep 15, 2018 Right Hand   September 15, 2018    1937    Hand   less than 1   Peripheral IV 09-15-2018 Right Forearm   Sep 15, 2018    1937    Forearm   less than 1   NG/OG Tube Orogastric [facility] mouth Xray   09/15/2018    [place] mouth   less than 1   Urethral Catheter philinda little EMT   Sep 15, 2018    1955    -   less than 1   Airway   10/06/15    1501      1067   Airway 7.5 mm   2018-09-15    1857     less than 1   Incision (Closed) 10/06/15 Breast Right   10/06/15    1519     1067          Intake/Output Last 24 hours  Intake/Output Summary (Last 24 hours) at 2018-09-15 2038 Last data filed at 15-Sep-2018 2004 Gross per 24 hour  Intake 1000 ml  Output -  Net 1000 ml    Labs/Imaging Results for orders placed or performed during the hospital encounter of 09/15/2018 (from the past 48 hour(s))  SARS Coronavirus 2 [facility] order, Performed in [facility] hospital lab) Nasopharyngeal Nasopharyngeal Swab     Status: None   Collection Time: Sep 15, 2018  7:08 PM   Specimen: Nasopharyngeal Swab  Result Value Ref Range   SARS Coronavirus 2 NEGATIVE NEGATIVE    Comment: (NOTE) If result is NEGATIVE SARS-CoV-2 target nucleic acids are NOT DETECTED. The SARS-CoV-2 RNA is generally detectable in upper and lower  respiratory specimens during the acute phase of infection. The lowest  concentration of SARS-CoV-2 viral copies this  assay can detect is 250  copies / mL. A negative result does not preclude SARS-CoV-2 infection  and should not be used as the sole basis for treatment or other  patient management decisions.  A negative result may occur with  improper specimen collection / handling, submission of specimen other  than nasopharyngeal swab, presence of viral mutation(s) within the  areas targeted by this assay, and inadequate number of viral copies  (<250 copies / mL). A negative result must be combined with clinical  observations, patient history, and epidemiological information. If result is POSITIVE SARS-CoV-2 target nucleic acids are DETECTED. The SARS-CoV-2 RNA is generally detectable in upper and lower  respiratory specimens dur ing the acute phase of infection.  Positive  results are indicative of active infection with SARS-CoV-2.  Clinical  correlation with patient history and other diagnostic information is  necessary to determine  patient infection status.  Positive results do  not rule out bacterial infection or co-infection with other viruses. If result is PRESUMPTIVE POSTIVE SARS-CoV-2 nucleic acids MAY BE PRESENT.   A presumptive positive result was obtained on the submitted specimen  and confirmed on repeat testing.  While 2019 novel coronavirus  (SARS-CoV-2) nucleic acids may be present in the submitted sample  additional confirmatory testing may be necessary for epidemiological  and / or clinical management purposes  to differentiate between  SARS-CoV-2 and other Sarbecovirus currently known to infect humans.  If clinically indicated additional testing with an alternate test  methodology [phone]([id number]) is advised. The SARS-CoV-2 RNA is generally  detectable in upper and lower respiratory sp ecimens during the acute  phase of infection. The expected result is Negative. Fact Sheet for Patients:  BoilerBrush.com.cyhttps://www.fda.gov/media/136312/download Fact Sheet for Healthcare Providers: https://pope.com/https://www.fda.gov/media/136313/download This test is not yet approved or cleared by the [place] FDA and has been authorized for detection and/or diagnosis of SARS-CoV-2 by FDA under an Emergency Use Authorization (EUA).  This EUA will remain in effect (meaning this test can be used) for the duration of the COVID-19 declaration under Section 564(b)(1) of the Act, 21 U.S.C. section 360bbb-3(b)(1), unless the authorization is terminated or revoked sooner. Performed at [facility], [address] [place], [place] [address]   I-Stat beta hCG blood, ED     Status: None   Collection Time: 09-27-2018  7:09 PM  Result Value Ref Range   I-stat hCG, quantitative <5.0 <5 mIU/mL   Comment 3            Comment:   GEST. AGE      CONC.  (mIU/mL)   <=1 WEEK        5 - 50     2 WEEKS       50 - 500     3 WEEKS       100 - 10,000     4 WEEKS     1,000 - 30,000        FEMALE AND NON-PREGNANT FEMALE:     LESS THAN 5 mIU/mL   I-stat chem 8,  ED     Status: Abnormal   Collection Time: 09-27-2018  7:11 PM  Result Value Ref Range   Sodium 138 135 - 145 mmol/L   Potassium 3.9 3.5 - 5.1 mmol/L   Chloride 106 98 - 111 mmol/L   BUN 22 (H) 6 - 20 mg/dL   Creatinine, Ser 8.111.30 (H) 0.44 - 1.00 mg/dL   Glucose, Bld 914406 (H) 70 - 99 mg/dL   Calcium,  Ion 1.07 (L) 1.15 - 1.40 mmol/L   TCO2 15 (L) 22 - 32 mmol/L   Hemoglobin 16.3 (H) 12.0 - 15.0 g/dL   HCT 46.9 (H) 62.9 - 52.8 %  CBG monitoring, ED     Status: Abnormal   Collection Time: 09/11/2018  7:23 PM  Result Value Ref Range   Glucose-Capillary 367 (H) 70 - 99 mg/dL  CBC     Status: Abnormal   Collection Time: 09/28/2018  7:28 PM  Result Value Ref Range   WBC 31.6 (H) 4.0 - 10.5 K/uL   RBC 4.28 3.87 - 5.11 MIL/uL   Hemoglobin 14.5 12.0 - 15.0 g/dL   HCT 41.3 (H) 24.4 - 01.0 %   MCV 109.1 (H) 80.0 - 100.0 fL   MCH 33.9 26.0 - 34.0 pg   MCHC 31.0 30.0 - 36.0 g/dL   RDW 27.2 53.6 - 64.4 %   Platelets 298 150 - 400 K/uL   nRBC 0.0 0.0 - 0.2 %    Comment: Performed at [facility], [address] [place], [place] [address]  Differential     Status: Abnormal   Collection Time: 09/06/2018  7:28 PM  Result Value Ref Range   Neutrophils Relative % 80 %   Neutro Abs 25.7 (H) 1.7 - 7.7 K/uL   Lymphocytes Relative 9 %   Lymphs Abs 2.8 0.7 - 4.0 K/uL   Monocytes Relative 9 %   Monocytes Absolute 2.7 (H) 0.1 - 1.0 K/uL   Eosinophils Relative 0 %   Eosinophils Absolute 0.0 0.0 - 0.5 K/uL   Basophils Relative 1 %   Basophils Absolute 0.2 (H) 0.0 - 0.1 K/uL   WBC Morphology MORPHOLOGY UNREMARKABLE    Immature Granulocytes 1 %   Abs Immature Granulocytes 0.25 (H) 0.00 - 0.07 K/uL    Comment: Performed at [facility], [address] [place], [place] [address]  Comprehensive metabolic panel     Status: Abnormal   Collection Time: 09/18/2018  7:28 PM  Result Value Ref Range   Sodium 134 (L) 135 - 145 mmol/L   Potassium 4.5 3.5 - 5.1 mmol/L   Chloride 101 98 - 111 mmol/L   CO2  11 (L) 22 - 32 mmol/L   Glucose, Bld 380 (H) 70 - 99 mg/dL   BUN 19 6 - 20 mg/dL   Creatinine, Ser 3.87 (H) 0.44 - 1.00 mg/dL   Calcium 7.9 (L) 8.9 - 10.3 mg/dL   Total Protein 5.9 (L) 6.5 - 8.1 g/dL   Albumin 3.2 (L) 3.5 - 5.0 g/dL   AST 58 (H) 15 - 41 U/L   ALT 26 0 - 44 U/L   Alkaline Phosphatase 60 38 - 126 U/L   Total Bilirubin 1.2 0.3 - 1.2 mg/dL   GFR calc non Af Amer 36 (L) >60 mL/min   GFR calc Af Amer 42 (L) >60 mL/min   Anion gap 22 (H) 5 - 15    Comment: Performed at [facility], [address], [place], [place] [address]  Lactic acid, plasma     Status: Abnormal   Collection Time: 09/06/2018  7:28 PM  Result Value Ref Range   Lactic Acid, Venous >11.0 (HH) 0.5 - 1.9 mmol/L    Comment: CRITICAL RESULT CALLED TO, READ BACK BY AND VERIFIED WITH: CHRISCO, C RN @ 1958 ON 09/06/2018 BY TEMOCHE, H Performed at [facility], [address], [place], [place] [address]   Protime-INR  Status: Abnormal   Collection Time: 09/29/2018  8:02 PM  Result Value Ref Range   Prothrombin Time 15.5 (H) 11.4 - 15.2 seconds   INR 1.3 (H) 0.8 - 1.2    Comment: (NOTE) INR goal varies based on device and disease states. Performed at [facility], [address] [place], [place] [address]   APTT     Status: None   Collection Time: 09/06/2018  8:02 PM  Result Value Ref Range   aPTT 24 24 - 36 seconds    Comment: Performed at [facility], [address] [place], [place] [address]   Ct Head Wo Contrast  Result Date: 09/11/2018 CLINICAL DATA:  Altered mental status. EXAM: CT HEAD WITHOUT CONTRAST TECHNIQUE: Contiguous axial images were obtained from the base of the skull through the vertex without intravenous contrast. COMPARISON:  Brain MR dated 12/15/2014. FINDINGS: Brain: Large amount of bilateral subarachnoid hemorrhage, most pronounced in the basilar cisterns, extending into the 4th ventricle. Interval mildly enlarged ventricles. Vascular: No hyperdense vessel or  unexpected calcification. Skull: Normal. Negative for fracture or focal lesion. Sinuses/Orbits: Tiny amount of fluid in the right maxillary sinus. Unremarkable orbits. Other: Endotracheal and orogastric tubes. IMPRESSION: 1. Large amount of bilateral subarachnoid hemorrhage, most pronounced in the basilar cisterns, extending into the 4th ventricle. 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 2. Mild hydrocephalus. 3. Minimal acute right maxillary sinusitis. Critical Value/emergent results were called by telephone at the time of interpretation on 09/17/2018 at 8:29 pm to Dr. Arby BarretteMARCY PFEIFFER , who verbally acknowledged these results. Electronically Signed   By: Beckie SaltsSteven  Reid M.D.   On: 09/27/2018 20:31   Dg Chest Portable 1 View  Result Date: 09/13/2018 CLINICAL DATA:  Altered mental status, post intubation EXAM: PORTABLE CHEST 1 VIEW COMPARISON:  Portable exam 1925 hours compared to 10/10/2007 FINDINGS: Tip of endotracheal tube 6 mm from carina, recommend withdrawal 1-2 cm. Nasogastric tube extends into stomach. Numerous EKG leads project over chest. Normal heart size and mediastinal contours. BILATERAL perihilar infiltrates greater on LEFT, could represent pneumonia or aspiration. No pleural effusion or pneumothorax. Bones unremarkable. IMPRESSION: Tip of endotracheal tube 6 mm from carina, recommend withdrawal 1-2 cm. BILATERAL pulmonary infiltrates, most severe in the LEFT upper lobe, question aspiration versus pneumonia Findings called to Dr. Donnald GarrePfeiffer on 09/30/2018 at 1938 hrs. Electronically Signed   By: Ulyses SouthwardMark  Boles M.D.   On: 09/29/2018 19:40    Pending Labs Unresulted Labs (From admission, onward)    Start     Ordered   09/02/2018 1954  Urinalysis, Routine w reflex microscopic  Once,   STAT     09/26/2018 1953   09/17/2018 1919  Lactic acid, plasma  Now then every 2 hours,   STAT     09/29/2018 1918          Vitals/Pain Today's Vitals   09/06/2018 2000  09/11/2018 2010 09/05/2018 2027 09/05/2018 2032  BP:  116/90 118/74 116/90  Pulse: 99 83 78 78  Resp: (!) 34 18 18 14   Temp: (!) 97.1 F (36.2 C)  (!) 97.1 F (36.2 C)   TempSrc:   Temporal   SpO2: 99% 96% 96% 94%  Height:        Isolation Precautions Airborne and Contact precautions  Medications Medications  fentaNYL 2500mcg in NS 250mL (3410mcg/ml) infusion-PREMIX (25 mcg/hr Intravenous New Bag/Given 09/25/2018 1959)  etomidate (AMIDATE) injection (20 mg Intravenous Given 09/09/2018  1850)  succinylcholine (ANECTINE) injection (100 mg Intravenous Given 09/25/2018 1851)  sodium chloride flush (NS) 0.9 % injection 3 mL (3 mLs Intravenous Given 09/27/2018 1902)  fentaNYL (SUBLIMAZE) injection ( Intravenous Canceled Entry 09/14/2018 1915)  midazolam (VERSED) 5 MG/5ML injection ( Intravenous Canceled Entry 09/29/2018 1915)  0.9 %  sodium chloride infusion ( Intravenous New Bag/Given 09/20/2018 2030)    Mobility walks     Focused Assessments Dr. Lovell Sheehan explained procedure ( ventriculostomy) tp pt.'s spouse . Spouse signed consent form .    R Recommendations: See Admitting Provider Note  Report given to:   Additional Notes:

## 2018-09-07 NOTE — ED Notes (Signed)
Patient has been intubated with RSI. X ray and CT have been notified

## 2018-09-07 NOTE — Procedures (Signed)
Intubation Procedure Note Joann Johnson [id number] 1960-01-27  Procedure: Intubation Indications: Respiratory insufficiency  Procedure Details Consent: Unable to obtain consent because of emergent medical necessity. Time Out: Verified patient identification, verified procedure, site/side was marked, verified correct patient position, special equipment/implants available, medications/allergies/relevent history reviewed, required imaging and test results available.  Performed  Maximum sterile technique was used including cap, gloves, gown, hand hygiene and mask.  MAC and 4    Evaluation Hemodynamic Status: Transient hypertension requiring treatment; O2 sats: transiently fell during during procedure Patient's Current Condition: stable Complications: No apparent complications Patient did tolerate procedure well. Chest X-ray ordered to verify placement.  CXR: pending.   Dimple Nanas 09/27/2018

## 2018-09-07 NOTE — H&P (Signed)
Subjective: The patient is a 58 year old white female with a history of hypertension who by her husband's report was normal when he left around 11:00 this morning to golf.  When he returned he found her unconscious laying next to the bed with agonal respirations.  She was brought [facility] via EMS and intubated.  She was worked up with a head CT which demonstrated a diffuse subarachnoid hemorrhage and hydrocephalus.  I was in the ER seeing another patient and Dr. Vallery Ridge asked me see this patient.  I saw her within minutes.  The patient is comatose and I get the history from her husband.  He states she is not anticoagulated.  Her father died of a stroke at age 78.  Past Medical History:  Diagnosis Date  . Hypercholesteremia   . Hypertension   . Osteoporosis 01/2018   T score -2.7 stable from prior DEXA  . Thrombosis    superficial thrombosis inankle on OCP as a young adult     Past Surgical History:  Procedure Laterality Date  . BREAST BIOPSY Right 10/06/2015   Procedure: RIGHT BREAST BIOPSY WITH NEEDLE LOCALIZATION;  Surgeon: Armandina Gemma, MD;  Location: [place];  Service: General;  Laterality: Right;  . BREAST EXCISIONAL BIOPSY Right 10/06/2015  . LAPAROSCOPY  1988   surgery by Dr, Olena Mater  . thumb surgery      No Known Allergies  Social History   Tobacco Use  . Smoking status: Never Smoker  . Smokeless tobacco: Never Used  Substance Use Topics  . Alcohol use: Yes    Comment: occassionally    Family History  Problem Relation Age of Onset  . Colon cancer Paternal Grandmother   . Hypertension Father   . Heart disease Father   . Stroke Father   . Hypertension Mother   . Heart failure Mother   . CAD Mother   . Stroke Mother   . Diabetes Maternal Aunt   . Hypertension Maternal Aunt   . Ovarian cancer Maternal Aunt   . Diabetes Maternal Uncle   . Hypertension Maternal Uncle   . Cancer Cousin 39       first maternal consin breast cancer  .  Hypertension Paternal Aunt   . Hypertension Paternal Uncle   . Ovarian cancer Maternal Aunt   . Cancer Cousin   . Cancer Cousin    Prior to Admission medications   Medication Sig Start Date End Date Taking? Authorizing Provider  ALPRAZolam (XANAX) 0.5 MG tablet TAKE 1 TABLET BY MOUTH AT BEDTIME AS NEEDED 07/21/18   Fontaine, Belinda Block, MD  CALCIUM PO Take by mouth.    [provider]  Cholecalciferol (VITAMIN D) 1000 UNITS capsule Take 2,000 Units by mouth daily.     [provider]  losartan (COZAAR) 50 MG tablet TAKE ONE TABLET BY MOUTH EVERY DAY 04/02/10   Romeo Apple, MD  metoprolol succinate (TOPROL-XL) 25 MG 24 hr tablet Take 25 mg by mouth daily. 1/2 daily     [provider]     Review of Systems  Positive ROS: Unobtainable  All other systems have been reviewed and were otherwise negative with the exception of those mentioned in the HPI and as above.  Objective: Vital signs in last 24 hours: Temp:  [36.2 C-37.6 C] 37.6 C (09/06 2115) Pulse Rate:  [74-109] 87 (09/06 2115) Resp:  [14-36] 26 (09/06 2115) BP: (111-144)/(74-104) 132/100 (09/06 2115) SpO2:  [89 %-100 %] 90 % (09/06  2115) FiO2 (%):  [100 %] 100 % (09/06 2032) Estimated body mass index is 22.05 kg/m as calculated from the following:   Height as of this encounter: 5\' 8"  (1.727 m).   Weight as of 11/13/17: 65.8 kg.   Physical exam:  General: A comatose intubated 58 year old white female  HEENT: The patient has a abrasion over the bridge of her nose.  Her pupils are 7 mm and irregular on the left, her right pupil is 3 mm.  Neither reacts.  Neck: No obvious deformities.  She is wearing a hard collar  Thorax: Symmetric.  She has a well-healed incision  in her right upper thorax  Abdomen: Obese  Extremities: Unremarkable  Neurologic exam: The patient is Glasgow Coma Scale 3 intubated.  She does not respond to noxious stimuli.  She has weak corneal reflexes bilaterally.  Her  pupils are as above she does not breathe over the vent or cough with tracheal suctioning.  I have reviewed the patient's head CT performed today.  She has a diffuse subarachnoid hemorrhage consistent with aneurysmal hemorrhage.  She has hydrocephalus.      Data Review Lab Results  Component Value Date   WBC 31.6 (H) 2018-12-16   HGB 14.6 2018-12-16   HCT 43.0 2018-12-16   MCV 109.1 (H) 2018-12-16   PLT 298 2018-12-16   Lab Results  Component Value Date   NA 141 2018-12-16   K 3.3 (L) 2018-12-16   CL 101 2018-12-16   CO2 11 (L) 2018-12-16   BUN 19 2018-12-16   CREATININE 1.55 (H) 2018-12-16   GLUCOSE 380 (H) 2018-12-16   Lab Results  Component Value Date   INR 1.3 (H) 2018-12-16    Assessment/Plan: Diffuse subarachnoid hemorrhage, hydrocephalus: I have discussed the situation with the patient's husband.  I have told him that his wife is in grave condition and likely will not survive this.  I have told him I suspect she has had an aneurysm to rupture.  Although her chances are slim, I recommend we place a ventriculostomy to treat her hydrocephalus.  I described the procedure the risks, benefits, alternatives and likelihood of achieving our goals with surgery.  I have answered all his questions.  He has consented on behalf of his wife.  If she improves we will consider further work-up with CTA or arteriogram.  I will ask critical care to manage the patient's medical issues.   Cristi LoronJeffrey D Diya Gervasi 09/17/2018 9:29 PM

## 2018-09-07 NOTE — Progress Notes (Signed)
ETT pulled back 1 cm  Per MD at bedside post Xray.  ETT now secured 25 cm @ lip.

## 2018-09-07 NOTE — ED Notes (Signed)
RT paged by secretary , O2 sat= 62% .

## 2018-09-07 NOTE — ED Notes (Signed)
Blood recollected by NT for pt.'s PT/INR.

## 2018-09-07 NOTE — ED Notes (Signed)
Transported to CT scan

## 2018-09-07 NOTE — ED Notes (Signed)
Admitting ICU MD and RT at bedside adjusting ventilator settings .

## 2018-09-07 NOTE — ED Notes (Signed)
Dr. Fransico Michael ( EDP) updated pt.'s family on her condition /plan of care and admission .

## 2018-09-07 NOTE — ED Notes (Signed)
Unable to collect blood specimens for blood cultures despite multiple attempts.

## 2018-09-07 NOTE — ED Triage Notes (Addendum)
Patient is brought to the ER from EMS with an altered mental status. Patient presents with blown pupil on the left side and is breathing irregularly. Patient was found by the family under the bed. Last known normal this am. Heart rate in the 120s and 1 liter given by EMS

## 2018-09-07 NOTE — ED Notes (Addendum)
Dr. Arnoldo Morale ( neurosurgeon) explained plan of care and procedure ( ventriculostomy)  to patient's spouse , spouse signed consent form .

## 2018-09-07 NOTE — ED Provider Notes (Signed)
[facility] EMERGENCY DEPARTMENT Provider Note   CSN: [id number] Arrival date & time: 09/06/2018  1846     History   Chief Complaint Chief Complaint  Patient presents with  . Altered Mental Status    left blown pupil     HPI Joann Johnson is a 58 y.o. female.     HPI Patient was reportedly seen by her husband at 27 AM.  At that time completely normal.  She has not been sick.  She does have medical history of hypertension.  Prior to arrival she was reportedly found under her bed and unresponsive.  EMS reports that fire medics had extricated her from beneath the bed by the time of their arrival.  Patient had jaw clenched and cyanotic appearance best O2 sat 70%.  EMS unable to intubate due to tightly clenched jaw.  Attempted nasal intubation.  Patient had a asymmetric enlarged pupil on the left side on their arrival.  No other trauma was identified.  Patient has not been responsive throughout transport. Past Medical History:  Diagnosis Date  . Hypercholesteremia   . Hypertension   . Osteoporosis 01/2018   T score -2.7 stable from prior DEXA  . 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 Abnormal mammogram of right breast 10/04/2015  . Fibrocystic breast changes 10/04/2015  . Hypertension   . Hypercholesteremia     Past Surgical History:  Procedure Laterality Date  . BREAST BIOPSY Right 10/06/2015   Procedure: RIGHT BREAST BIOPSY WITH NEEDLE LOCALIZATION;  Surgeon: Darnell Level, MD;  Location: [facility];  Service: General;  Laterality: Right;  . BREAST EXCISIONAL BIOPSY Right 10/06/2015  . LAPAROSCOPY  1988   surgery by Dr, Kyra Manges  . thumb surgery       OB History    Gravida  0   Para  0   Term      Preterm      AB      Living  0     SAB      TAB      Ectopic      Multiple      Live Births               Home Medications    Prior to Admission medications   Medication Sig Start Date End Date Taking? Authorizing Provider  ALPRAZolam (XANAX) 0.5 MG tablet TAKE 1 TABLET BY MOUTH AT BEDTIME AS NEEDED 07/21/18   Fontaine, Nadyne Coombes, MD  CALCIUM PO Take by mouth.    [provider]  Cholecalciferol (VITAMIN D) 1000 UNITS capsule Take 2,000 Units by mouth daily.     [provider]  losartan (COZAAR) 50 MG tablet TAKE ONE TABLET BY MOUTH EVERY DAY 04/02/10   Roger Shelter, MD  metoprolol succinate (TOPROL-XL) 25 MG 24 hr tablet Take 25 mg by mouth daily. 1/2 daily     [provider]    Family History Family History  Problem Relation Age of Onset  . Colon cancer Paternal Grandmother   . Hypertension Father   . Heart disease Father   . Stroke Father   . Hypertension Mother   . Heart failure Mother   . CAD Mother   . Stroke Mother   . Diabetes Maternal Aunt   . Hypertension Maternal Aunt   . Ovarian cancer Maternal Aunt   .  Diabetes Maternal Uncle   . Hypertension Maternal Uncle   . Cancer Cousin 60       first maternal consin breast cancer  . Hypertension Paternal Aunt   . Hypertension Paternal Uncle   . Ovarian cancer Maternal Aunt   . Cancer Cousin   . Cancer Cousin     Social History Social History   Tobacco Use  . Smoking status: Never Smoker  . Smokeless tobacco: Never Used  Substance Use Topics  . Alcohol use: Yes    Comment: occassionally  . Drug use: No     Allergies   Patient has no known allergies.   Review of Systems Review of Systems Level 5 caveat cannot obtain review of systems due to patient condition.  Physical Exam Updated Vital Signs LMP 01/02/2000   Physical Exam Constitutional:      Appearance: She is well-developed.     Comments: Patient is in extreme distress.  Sonorous respirations tachypneic slightly cyanotic appearance.  HENT:     Head:     Comments: No obvious head trauma.    Nose:     Comments: Patient has a nasal trumpet  in with slightly blood-tinged secretions.    Mouth/Throat:     Comments: Upon intubation, copious frothy secretions in the airway. Eyes:     Comments: Left pupil 6 mm right pupil 2 mm.  Neck:     Musculoskeletal: Neck supple.     Comments: C-collar in place. Cardiovascular:     Rate and Rhythm: Tachycardia present.     Pulses: Normal pulses.     Heart sounds: Normal heart sounds.  Pulmonary:     Comments: Labored breathing through clenched jaw. Abdominal:     General: There is no distension.     Palpations: Abdomen is soft.  Musculoskeletal:     Comments: No apparent extremity injuries or deformities.  Skin:    General: Skin is warm and dry.  Neurological:     GCS: GCS eye subscore is 4. GCS verbal subscore is 5. GCS motor subscore is 6.     Comments: Patient is obtunded without response to pain.  Jaw is tight but extremities are predominantly flaccid.      ED Treatments / Results  Labs (all labs ordered are listed, but only abnormal results are displayed) Labs Reviewed  SARS CORONAVIRUS 2 (HOSPITAL ORDER, PERFORMED IN [facility])  PROTIME-INR  APTT  CBC  DIFFERENTIAL  COMPREHENSIVE METABOLIC PANEL  I-STAT CHEM 8, ED  CBG MONITORING, ED  I-STAT BETA HCG BLOOD, ED ([facility], [facility], [facility] ONLY)    EKG EKG Interpretation  Date/Time:  Sunday September 07 2018 18:50:00 EDT Ventricular Rate:  106 PR Interval:    QRS Duration: 110 QT Interval:  327 QTC Calculation: 435 R Axis:   58 Text Interpretation:  Sinus tachycardia Probable left atrial enlargement LVH with secondary repolarization abnormality Minimal ST elevation, anterolateral leads Confirmed by Arby BarrettePfeiffer, Riona Lahti [phone]([id number]) on 09/19/2018 7:14:44 PM   Radiology No results found.  Procedures Procedure Name: Intubation Date/Time: 09/08/2018 7:15 PM Performed by: Arby BarrettePfeiffer, Atia Haupt, MD Pre-anesthesia Checklist: Patient identified, Patient being monitored, Emergency Drugs available and Suction available Oxygen  Delivery Method: Non-rebreather mask Induction Type: Rapid sequence Ventilation: Mask ventilation without difficulty Laryngoscope Size: Glidescope and 4 Number of attempts: 3 Placement Confirmation: ETT inserted through vocal cords under direct vision,  CO2 detector,  Breath sounds checked- equal and bilateral and Positive ETCO2 Tube secured with: ETT holder Dental Injury: Teeth and Oropharynx as  per pre-operative assessment  Difficulty Due To: Difficulty was anticipated Comments: Difficulty due to copious frothy white secretions obscuring airway.  Patient required suctioning and period of bagging.  Patient had persistent frothy edema from the airway requiring continuous suctioning.  Airway placed by respiratory therapist with repeat suctioning and ultimately direct visualization.      (including critical care time) CRITICAL CARE Performed by: Charlesetta Shanks   Total critical care time: 30  minutes  Critical care time was exclusive of separately billable procedures and treating other patients.  Critical care was necessary to treat or prevent imminent or life-threatening deterioration.  Critical care was time spent personally by me on the following activities: development of treatment plan with patient and/or surrogate as well as nursing, discussions with consultants, evaluation of patient's response to treatment, examination of patient, obtaining history from patient or surrogate, ordering and performing treatments and interventions, ordering and review of laboratory studies, ordering and review of radiographic studies, pulse oximetry and re-evaluation of patient's condition. Medications Ordered in ED Medications  midazolam (VERSED) 2 MG/2ML injection (has no administration in time range)  fentaNYL (SUBLIMAZE) 100 MCG/2ML injection (has no administration in time range)  fentaNYL (SUBLIMAZE) injection (50 mcg Intravenous Given 2018-09-13 1903)  midazolam (VERSED) 5 MG/5ML injection (5 mg  Intravenous Given 13-Sep-2018 1904)  fentaNYL 2539mcg in NS 212mL (60mcg/ml) infusion-PREMIX (has no administration in time range)  etomidate (AMIDATE) injection (20 mg Intravenous Given September 13, 2018 1850)  succinylcholine (ANECTINE) injection (100 mg Intravenous Given 09/13/2018 1851)  sodium chloride flush (NS) 0.9 % injection 3 mL (3 mLs Intravenous Given 09/13/18 1902)     Initial Impression / Assessment and Plan / ED Course  I have reviewed the triage vital signs and the nursing notes.  Pertinent labs & imaging results that were available during my care of the patient were reviewed by me and considered in my medical decision making (see chart for details).  Clinical Course as of Sep 10 833  Sun Sep 07, 2018  2008 Consult: Reviewed with Dr. Arnoldo Morale who will evaluate the patient.   [MP]    Clinical Course User Index [MP] Charlesetta Shanks, MD      Patient presented as outlined above.  She presented in severe distress.  Patient immediately intubated.  CT shows diffuse subarachnoid hemorrhage.  Dr. Arnoldo Morale has been consulted and is discussing management plan with the patient's husband.  Final Clinical Impressions(s) / ED Diagnoses   Final diagnoses:  Acute respiratory failure ([place])  Encounter for orogastric (OG) tube placement  Respiratory failure ([place])  SAH (subarachnoid hemorrhage) ([place])  Intracranial vasospasm    ED Discharge Orders    None       Charlesetta Shanks, MD 09/11/18 [phone]

## 2018-09-07 NOTE — Consult Note (Signed)
NAME:  Joann Johnson, MRN:  [id number], DOB:  1960/03/09, LOS: 0 ADMISSION DATE:  09/24/2018, CONSULTATION DATE: 9/6 REFERRING MD:  Johnney Killian, CHIEF COMPLAINT:  Comatose   Brief History   Joann Johnson is a 58 year old woman with a history of HTN, HLD, osteoporosis found unconscious today, found to have SAH and hydrocephalus.      History of present illness   Intubated on arrival to ED. Comatose, hypoxic.   Ventriculostomy (R frontal) placed via Burr hole.   Hypoxemic to 70s on EMS arrival, they were unable to intubate due to jaw clenching. Labs notable for Lactic acidosis, leukocytosis.  Per husband, had been normal self this am.  No complaints of any signs of illness prior to his leaving her this am.   Past Medical History  HTN HLD  Home meds xanax, vitamin D, calcium, Cozaar, metoprolol 12.5 mg daily.  Significant Hospital Events     Consults:  Neurosurgery  PCCM  Procedures:  Ventriculostomy 9/6 PM   Significant Diagnostic Tests:  CT head IMPRESSION: 1. Large amount of bilateral subarachnoid hemorrhage, most pronounced in the basilar cisterns, extending into the 4th ventricle. 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 2. Mild hydrocephalus. 3. Minimal acute right maxillary sinusitis.   CXR:  BILATERAL pulmonary infiltrates, most severe in the LEFT upper lobe, question aspiration versus pneumonia Micro Data:  Bl cx ordered resp cx ordered UA ordered  Antimicrobials:  Zosyn 9/6 Vanc 9/6  Interim history/subjective:    Objective   Blood pressure (!) 136/108, pulse 91, temperature 99.8 F (37.7 C), resp. rate (!) 36, height 5\' 8"  (1.727 m), last menstrual period 01/02/2000, SpO2 (!) 88 %.    Vent Mode: PRVC FiO2 (%):  [100 %] 100 % Set Rate:  [14 bmp] 14 bmp Vt Set:  [510 mL] 510 mL PEEP:  [8 cmH20] 8 cmH20 Plateau Pressure:  [24 cmH20] 24 cmH20   Intake/Output Summary (Last 24 hours) at 09/08/2018  2212 Last data filed at 09/20/2018 2004 Gross per 24 hour  Intake 1000 ml  Output -  Net 1000 ml   There were no vitals filed for this visit.  Examination: General: Non responsive, no movement to pain HENT:  L pupil 100mm, R pupil 48mm - neither reactive Lungs: CTAB anteriorly no wheeze Cardiovascular: tachy, no murmur Abdomen:NT, ND, NBS Extremities: no edema Neuro: Non responsive to stimuli, breathing over set rate, no cough   Resolved Hospital Problem list     Assessment & Plan:  SAH:  S/p ventriculostomy.  Decadron, nimodopine, NS ordered by Neurosurgeon.  Cont monitoring.   Hypoxemia: Aspiration PNA, +/- ARDS.   Low tidal volume ventilation as tolerated.  Goal pH >7.3, Sat 88-95%.  Increased PEEP to 12.  FIo2 currently at 100% Vanc and zosyn for possible PNA.  Repeat ABG midnight.  Arterial line.   Lactic acidosis:  Held fluids currently due to ARDS.  Received 1L NS Recheck pending.   Leukocytosis:  Possible aspiration PNA?   Blood, sputum cx pending.  UA pending.  vanc and zosyn empirically as above.    AKI:  Cr increasing, no UOP in foley.  Cont to monitor.   HTN: antiHTN held     [employer]:  Diet: NPO Pain/Anxiety/Delirium protocol (if indicated): on fentanyl in ED VAP protocol (if indicated): yes DVT prophylaxis: scds GI prophylaxis: ppi Glucose control: ISS Mobility: bedrest Code Status:DNR Family Communication: husband at bedside Disposition:  Prognosis is very poor at this time, given severity of bleed and comatose on physical exam, as well as profound hypoxemia.   Labs   CBC: Recent Labs  Lab 09/21/2018 1911 09/24/2018 1928 09/13/2018 2033  WBC  --  31.6*  --   NEUTROABS  --  25.7*  --   HGB 16.3* 14.5 14.6  HCT 48.0* 46.7* 43.0  MCV  --  109.1*  --   PLT  --  298  --     Basic Metabolic Panel: Recent Labs  Lab 09/06/2018 1911 09/02/2018 1928 09/21/2018 2033  NA 138 134* 141  K 3.9 4.5 3.3*  CL 106 101  --   CO2  --  11*  --    GLUCOSE 406* 380*  --   BUN 22* 19  --   CREATININE 1.30* 1.55*  --   CALCIUM  --  7.9*  --    GFR: CrCl cannot be calculated (Unknown ideal weight.). Recent Labs  Lab 09/05/2018 1928  WBC 31.6*  LATICACIDVEN >11.0*    Liver Function Tests: Recent Labs  Lab 09/23/2018 1928  AST 58*  ALT 26  ALKPHOS 60  BILITOT 1.2  PROT 5.9*  ALBUMIN 3.2*   No results for input(s): LIPASE, AMYLASE in the last 168 hours. No results for input(s): AMMONIA in the last 168 hours.  ABG    Component Value Date/Time   PHART 7.145 (LL) 09/09/2018 2033   PCO2ART 46.9 09/08/2018 2033   PO2ART 68.0 (L) 09/29/2018 2033   HCO3 16.1 (L) 09/11/2018 2033   TCO2 17 (L) 09/24/2018 2033   ACIDBASEDEF 13.0 (H) 09/11/2018 2033   O2SAT 86.0 09/30/2018 2033     Coagulation Profile: Recent Labs  Lab 09/11/2018 2002  INR 1.3*    Cardiac Enzymes: No results for input(s): CKTOTAL, CKMB, CKMBINDEX, TROPONINI in the last 168 hours.  HbA1C: No results found for: HGBA1C  CBG: Recent Labs  Lab 09/27/2018 1923  GLUCAP 367*    Review of Systems:   Unable to assess due to mental status  Past Medical History  She,  has a past medical history of Hypercholesteremia, Hypertension, Osteoporosis (01/2018), and Thrombosis.   Surgical History    Past Surgical History:  Procedure Laterality Date  . BREAST BIOPSY Right 10/06/2015   Procedure: RIGHT BREAST BIOPSY WITH NEEDLE LOCALIZATION;  Surgeon: Darnell Levelodd Gerkin, MD;  Location: [facility];  Service: General;  Laterality: Right;  . BREAST EXCISIONAL BIOPSY Right 10/06/2015  . LAPAROSCOPY  1988   surgery by Dr, Kyra Mangesean McPhail  . thumb surgery       Social History   reports that she has never smoked. She has never used smokeless tobacco. She reports current alcohol use. She reports that she does not use drugs.   Family History   Her family history includes CAD in her mother; Cancer in her cousin and cousin; Cancer (age of onset: 8460) in her cousin;  Colon cancer in her paternal grandmother; Diabetes in her maternal aunt and maternal uncle; Heart disease in her father; Heart failure in her mother; Hypertension in her father, maternal aunt, maternal uncle, mother, paternal aunt, and paternal uncle; Ovarian cancer in her maternal aunt and maternal aunt; Stroke in her father and mother.   Allergies No Known Allergies   Home Medications  Prior to Admission medications   Medication Sig Start Date End Date Taking? Authorizing Provider  ALPRAZolam Prudy Feeler(XANAX) 0.5 MG tablet TAKE 1 TABLET BY MOUTH AT BEDTIME AS NEEDED 07/21/18  Fontaine, Nadyne Coombes, MD  CALCIUM PO Take by mouth.    [provider]  Cholecalciferol (VITAMIN D) 1000 UNITS capsule Take 2,000 Units by mouth daily.     [provider]  losartan (COZAAR) 50 MG tablet TAKE ONE TABLET BY MOUTH EVERY DAY 04/02/10   Roger Shelter, MD  metoprolol succinate (TOPROL-XL) 25 MG 24 hr tablet Take 25 mg by mouth daily. 1/2 daily     [provider]     Critical care time: 65 minutes.

## 2018-09-07 NOTE — Progress Notes (Signed)
Patient transported to CT and back to Trauma C without incident.

## 2018-09-07 NOTE — ED Notes (Addendum)
Dr. Arnoldo Morale notified on Bicarb result=16. Dr. Arnoldo Morale at bedside performing ventriculostomy.

## 2018-09-07 NOTE — Progress Notes (Signed)
Pharmacy Antibiotic Note  Joann Johnson is a 58 y.o. female admitted on September 17, 2018 with pneumonia.  Pharmacy has been consulted for vancomycin and zosyn dosing.   Plan: Vancomycin 1500 mg IV q48 hours Zosyn EI F/u renal function, cultures and clinical course  Height: 5\' 1"  (154.9 cm) Weight: 165 lb 5.5 oz (75 kg) IBW/kg (Calculated) : 47.8  Temp (24hrs), Avg:99.4 F (37.4 C), Min:97.1 F (36.2 C), Max:100.6 F (38.1 C)  Recent Labs  Lab 09/17/18 1911 09-17-2018 1928  WBC  --  31.6*  CREATININE 1.30* 1.55*  LATICACIDVEN  --  >11.0*    Estimated Creatinine Clearance: 35.8 mL/min (A) (by C-G formula based on SCr of 1.55 mg/dL (H)).    No Known Allergies   Thank you for allowing pharmacy to be a part of this patient's care.  Excell Seltzer Poteet 09/17/2018 10:49 PM

## 2018-09-07 NOTE — Progress Notes (Signed)
Critical ABG results given to MD. No changes at this time.  MD Arnoldo Morale at bedside for procedure.

## 2018-09-07 NOTE — Op Note (Signed)
Brief history: The patient is a 58 year old white female who was found unconscious this afternoon by her husband.  She was intubated at the most [place] and worked up with a head CT which demonstrated a diffuse subarachnoid hemorrhage and hydrocephalus.  I discussed the situation with the patient's husband and recommended placement of a ventriculostomy.  I described this procedure, the risks, benefits and alternatives.  I have answered all his questions.  He has consented on behalf of his wife.  Preop diagnosis: Subarachnoid hemorrhage, hydrocephalus  Postop diagnosis: The same  Procedure: Placement of right frontal ventriculostomy via bur hole  Surgeon: Dr. Earle Gell  Assistant: None  Anesthesia: Local  Estimated blood loss: Minimal  Specimens: None  Complications: None  Description of procedure: The patient's right frontal scalp was shaved with clippers.  This region was then prepared with DuraPrep.  Sterile drapes were applied.  I injected the area to be incised with lidocaine with epinephrine.  Ice to scalpel to make an incision in the mid pupillary line at approximately the coronal suture.  I used the self-retaining retractor for exposure.  I then used the eggbeater drill to create a right frontal burr hole.  I incised the underlying dura.  I cannulated the patient's ventricular system using standard trajectories on the first pass at approximately 5 cm.  There was brisk flow of cerebrospinal fluid.  I tunneled the ventricular catheter under the scalp and connected to the drainage system.  I then removed the retractor and reapproximated patient's scalp with a running 3-0 nylon suture.  I secured the ventriculostomy with 3-0 nylon sutures.  The drapes were removed.  The patient tolerated the procedure well.  The ventriculostomy was placed at 15 cm of water in the open position.

## 2018-09-08 ENCOUNTER — Inpatient Hospital Stay (HOSPITAL_COMMUNITY): Payer: BC Managed Care – PPO

## 2018-09-08 ENCOUNTER — Encounter (HOSPITAL_COMMUNITY): Payer: Self-pay | Admitting: Acute Care

## 2018-09-08 DIAGNOSIS — R6521 Severe sepsis with septic shock: Secondary | ICD-10-CM

## 2018-09-08 DIAGNOSIS — A419 Sepsis, unspecified organism: Secondary | ICD-10-CM

## 2018-09-08 DIAGNOSIS — R579 Shock, unspecified: Secondary | ICD-10-CM

## 2018-09-08 HISTORY — PX: CENTRAL LINE: PRO85

## 2018-09-08 LAB — POCT I-STAT 7, (LYTES, BLD GAS, ICA,H+H)
Acid-base deficit: 10 mmol/L — ABNORMAL HIGH (ref 0.0–2.0)
Acid-base deficit: 14 mmol/L — ABNORMAL HIGH (ref 0.0–2.0)
Acid-base deficit: 6 mmol/L — ABNORMAL HIGH (ref 0.0–2.0)
Acid-base deficit: 9 mmol/L — ABNORMAL HIGH (ref 0.0–2.0)
Acid-base deficit: 9 mmol/L — ABNORMAL HIGH (ref 0.0–2.0)
Bicarbonate: 13.4 mmol/L — ABNORMAL LOW (ref 20.0–28.0)
Bicarbonate: 15.5 mmol/L — ABNORMAL LOW (ref 20.0–28.0)
Bicarbonate: 16.9 mmol/L — ABNORMAL LOW (ref 20.0–28.0)
Bicarbonate: 18.1 mmol/L — ABNORMAL LOW (ref 20.0–28.0)
Bicarbonate: 18.1 mmol/L — ABNORMAL LOW (ref 20.0–28.0)
Calcium, Ion: 1.08 mmol/L — ABNORMAL LOW (ref 1.15–1.40)
Calcium, Ion: 1.09 mmol/L — ABNORMAL LOW (ref 1.15–1.40)
Calcium, Ion: 1.09 mmol/L — ABNORMAL LOW (ref 1.15–1.40)
Calcium, Ion: 1.11 mmol/L — ABNORMAL LOW (ref 1.15–1.40)
Calcium, Ion: 1.16 mmol/L (ref 1.15–1.40)
HCT: 38 % (ref 36.0–46.0)
HCT: 39 % (ref 36.0–46.0)
HCT: 42 % (ref 36.0–46.0)
HCT: 44 % (ref 36.0–46.0)
HCT: 49 % — ABNORMAL HIGH (ref 36.0–46.0)
Hemoglobin: 12.9 g/dL (ref 12.0–15.0)
Hemoglobin: 13.3 g/dL (ref 12.0–15.0)
Hemoglobin: 14.3 g/dL (ref 12.0–15.0)
Hemoglobin: 15 g/dL (ref 12.0–15.0)
Hemoglobin: 16.7 g/dL — ABNORMAL HIGH (ref 12.0–15.0)
O2 Saturation: 84 %
O2 Saturation: 87 %
O2 Saturation: 94 %
O2 Saturation: 96 %
O2 Saturation: 97 %
Patient temperature: 100.9
Patient temperature: 98.6
Patient temperature: 98.6
Patient temperature: 98.6
Patient temperature: 99
Potassium: 4 mmol/L (ref 3.5–5.1)
Potassium: 4.1 mmol/L (ref 3.5–5.1)
Potassium: 4.2 mmol/L (ref 3.5–5.1)
Potassium: 4.3 mmol/L (ref 3.5–5.1)
Potassium: 4.5 mmol/L (ref 3.5–5.1)
Sodium: 139 mmol/L (ref 135–145)
Sodium: 143 mmol/L (ref 135–145)
Sodium: 144 mmol/L (ref 135–145)
Sodium: 145 mmol/L (ref 135–145)
Sodium: 145 mmol/L (ref 135–145)
TCO2: 15 mmol/L — ABNORMAL LOW (ref 22–32)
TCO2: 17 mmol/L — ABNORMAL LOW (ref 22–32)
TCO2: 18 mmol/L — ABNORMAL LOW (ref 22–32)
TCO2: 19 mmol/L — ABNORMAL LOW (ref 22–32)
TCO2: 19 mmol/L — ABNORMAL LOW (ref 22–32)
pCO2 arterial: 30.8 mmHg — ABNORMAL LOW (ref 32.0–48.0)
pCO2 arterial: 34.5 mmHg (ref 32.0–48.0)
pCO2 arterial: 35.2 mmHg (ref 32.0–48.0)
pCO2 arterial: 35.9 mmHg (ref 32.0–48.0)
pCO2 arterial: 42 mmHg (ref 32.0–48.0)
pH, Arterial: 7.181 — CL (ref 7.350–7.450)
pH, Arterial: 7.243 — ABNORMAL LOW (ref 7.350–7.450)
pH, Arterial: 7.259 — ABNORMAL LOW (ref 7.350–7.450)
pH, Arterial: 7.299 — ABNORMAL LOW (ref 7.350–7.450)
pH, Arterial: 7.378 (ref 7.350–7.450)
pO2, Arterial: 58 mmHg — ABNORMAL LOW (ref 83.0–108.0)
pO2, Arterial: 61 mmHg — ABNORMAL LOW (ref 83.0–108.0)
pO2, Arterial: 84 mmHg (ref 83.0–108.0)
pO2, Arterial: 91 mmHg (ref 83.0–108.0)
pO2, Arterial: 94 mmHg (ref 83.0–108.0)

## 2018-09-08 LAB — GLUCOSE, CAPILLARY
Glucose-Capillary: 132 mg/dL — ABNORMAL HIGH (ref 70–99)
Glucose-Capillary: 151 mg/dL — ABNORMAL HIGH (ref 70–99)
Glucose-Capillary: 170 mg/dL — ABNORMAL HIGH (ref 70–99)
Glucose-Capillary: 174 mg/dL — ABNORMAL HIGH (ref 70–99)
Glucose-Capillary: 182 mg/dL — ABNORMAL HIGH (ref 70–99)
Glucose-Capillary: 192 mg/dL — ABNORMAL HIGH (ref 70–99)

## 2018-09-08 LAB — URINALYSIS, ROUTINE W REFLEX MICROSCOPIC
Bilirubin Urine: NEGATIVE
Glucose, UA: 150 mg/dL — AB
Ketones, ur: 5 mg/dL — AB
Leukocytes,Ua: NEGATIVE
Nitrite: NEGATIVE
Protein, ur: 30 mg/dL — AB
Specific Gravity, Urine: 1.018 (ref 1.005–1.030)
pH: 5 (ref 5.0–8.0)

## 2018-09-08 LAB — BASIC METABOLIC PANEL
Anion gap: 13 (ref 5–15)
BUN: 17 mg/dL (ref 6–20)
CO2: 20 mmol/L — ABNORMAL LOW (ref 22–32)
Calcium: 6.6 mg/dL — ABNORMAL LOW (ref 8.9–10.3)
Chloride: 110 mmol/L (ref 98–111)
Creatinine, Ser: 1.04 mg/dL — ABNORMAL HIGH (ref 0.44–1.00)
GFR calc Af Amer: >60 mL/min (ref >60)
GFR calc non Af Amer: 58 mL/min — ABNORMAL LOW (ref >60)
Glucose, Bld: 182 mg/dL — ABNORMAL HIGH (ref 70–99)
Potassium: 4.4 mmol/L (ref 3.5–5.1)
Sodium: 143 mmol/L (ref 135–145)

## 2018-09-08 LAB — CBC
HCT: 43.3 % (ref 36.0–46.0)
Hemoglobin: 13.9 g/dL (ref 12.0–15.0)
MCH: 34 pg (ref 26.0–34.0)
MCHC: 32.1 g/dL (ref 30.0–36.0)
MCV: 105.9 fL — ABNORMAL HIGH (ref 80.0–100.0)
Platelets: 202 10*3/uL (ref 150–400)
RBC: 4.09 MIL/uL (ref 3.87–5.11)
RDW: 12.8 % (ref 11.5–15.5)
WBC: 19.6 10*3/uL — ABNORMAL HIGH (ref 4.0–10.5)
nRBC: 0 % (ref 0.0–0.2)

## 2018-09-08 LAB — LACTIC ACID, PLASMA
Lactic Acid, Venous: 6.3 mmol/L (ref 0.5–1.9)
Lactic Acid, Venous: 4.8 mmol/L (ref 0.5–1.9)
Lactic Acid, Venous: 7.3 mmol/L (ref 0.5–1.9)
Lactic Acid, Venous: 4.5 mmol/L (ref 0.5–1.9)
Lactic Acid, Venous: 3.2 mmol/L (ref 0.5–1.9)

## 2018-09-08 LAB — MRSA PCR SCREENING: MRSA by PCR: NEGATIVE

## 2018-09-08 LAB — MAGNESIUM: Magnesium: 1.7 mg/dL (ref 1.7–2.4)

## 2018-09-08 LAB — TRIGLYCERIDES: Triglycerides: 303 mg/dL — ABNORMAL HIGH (ref <150)

## 2018-09-08 MED ORDER — PROPOFOL 1000 MG/100ML IV EMUL
5.0000 ug/kg/min | INTRAVENOUS | Status: DC
  Administered 2018-09-08: 10:00:00 70 ug/kg/min via INTRAVENOUS
  Administered 2018-09-08: 20 ug/kg/min via INTRAVENOUS
  Administered 2018-09-08: 40 ug/kg/min via INTRAVENOUS
  Administered 2018-09-09: 35 ug/kg/min via INTRAVENOUS
  Administered 2018-09-09: 12:00:00 30 ug/kg/min via INTRAVENOUS
  Administered 2018-09-10: 50 ug/kg/min via INTRAVENOUS
  Administered 2018-09-10: 01:00:00 35 ug/kg/min via INTRAVENOUS
  Administered 2018-09-10 (×2): 30 ug/kg/min via INTRAVENOUS
  Administered 2018-09-11: 20 ug/kg/min via INTRAVENOUS
  Administered 2018-09-11: 50 ug/kg/min via INTRAVENOUS
  Administered 2018-09-12: 22 ug/kg/min via INTRAVENOUS
  Filled 2018-09-08: qty 100
  Filled 2018-09-08: qty 200
  Filled 2018-09-08 (×11): qty 100

## 2018-09-08 MED ORDER — LACTATED RINGERS IV BOLUS
500.0000 mL | Freq: Once | INTRAVENOUS | Status: AC
  Administered 2018-09-08: 500 mL via INTRAVENOUS

## 2018-09-08 MED ORDER — ORAL CARE MOUTH RINSE
15.0000 mL | OROMUCOSAL | Status: DC
  Administered 2018-09-08 – 2018-09-12 (×47): 15 mL via OROMUCOSAL

## 2018-09-08 MED ORDER — FENTANYL CITRATE (PF) 100 MCG/2ML IJ SOLN: 50.0000 ug | INTRAMUSCULAR | Status: DC | PRN

## 2018-09-08 MED ORDER — VANCOMYCIN HCL 10 G IV SOLR
1500.0000 mg | INTRAVENOUS | Status: DC
  Administered 2018-09-08: 1500 mg via INTRAVENOUS
  Filled 2018-09-08 (×2): qty 1500

## 2018-09-08 MED ORDER — PHENYLEPHRINE HCL-NACL 40-0.9 MG/250ML-% IV SOLN
0.0000 ug/min | INTRAVENOUS | Status: DC
  Administered 2018-09-08: 255 ug/min via INTRAVENOUS
  Filled 2018-09-08 (×2): qty 250

## 2018-09-08 MED ORDER — NOREPINEPHRINE 16 MG/250ML-% IV SOLN
0.0000 ug/min | INTRAVENOUS | Status: DC
  Administered 2018-09-08: 2 ug/min via INTRAVENOUS
  Administered 2018-09-09: 16 ug/min via INTRAVENOUS
  Filled 2018-09-08: qty 500

## 2018-09-08 MED ORDER — ACETAMINOPHEN 160 MG/5ML PO SOLN
650.0000 mg | ORAL | Status: DC | PRN
  Administered 2018-09-08: 650 mg via ORAL
  Filled 2018-09-08: qty 20.3

## 2018-09-08 MED ORDER — SODIUM BICARBONATE 8.4 % IV SOLN
INTRAVENOUS | Status: AC
  Filled 2018-09-08: qty 50

## 2018-09-08 MED ORDER — SODIUM CHLORIDE 0.9 % IV SOLN
INTRAVENOUS | Status: DC
  Administered 2018-09-08 – 2018-09-11 (×3): via INTRAVENOUS

## 2018-09-08 MED ORDER — SODIUM BICARBONATE 8.4 % IV SOLN
100.0000 meq | Freq: Once | INTRAVENOUS | Status: AC
  Administered 2018-09-08: 06:00:00 100 meq via INTRAVENOUS

## 2018-09-08 MED ORDER — NIMODIPINE 30 MG PO CAPS
60.0000 mg | ORAL_CAPSULE | ORAL | Status: DC
  Filled 2018-09-08 (×3): qty 2

## 2018-09-08 MED ORDER — VASOPRESSIN 20 UNIT/ML IV SOLN
0.0400 [IU]/min | INTRAVENOUS | Status: DC
  Administered 2018-09-08 – 2018-09-10 (×3): 0.04 [IU]/min via INTRAVENOUS
  Filled 2018-09-08 (×5): qty 2

## 2018-09-08 MED ORDER — NIMODIPINE 6 MG/ML PO SOLN
60.0000 mg | ORAL | Status: DC
  Administered 2018-09-08: 01:00:00 60 mg via ORAL
  Filled 2018-09-08: qty 10

## 2018-09-08 MED ORDER — MIDAZOLAM HCL 2 MG/2ML IJ SOLN
INTRAMUSCULAR | Status: AC
  Filled 2018-09-08: qty 2

## 2018-09-08 MED ORDER — NIMODIPINE 6 MG/ML PO SOLN
60.0000 mg | ORAL | Status: DC
  Administered 2018-09-08 – 2018-09-12 (×28): 60 mg
  Filled 2018-09-08 (×27): qty 10

## 2018-09-08 MED ORDER — NIMODIPINE 30 MG PO CAPS: 60.0000 mg | ORAL_CAPSULE | ORAL | Status: DC

## 2018-09-08 MED ORDER — PHENYLEPHRINE HCL-NACL 10-0.9 MG/250ML-% IV SOLN
0.0000 ug/min | INTRAVENOUS | Status: DC
  Administered 2018-09-08: 245 ug/min via INTRAVENOUS
  Administered 2018-09-08: 275 ug/min via INTRAVENOUS
  Administered 2018-09-08: 270 ug/min via INTRAVENOUS
  Administered 2018-09-08: 225 ug/min via INTRAVENOUS
  Administered 2018-09-08: 03:00:00 20 ug/min via INTRAVENOUS
  Filled 2018-09-08 (×3): qty 250
  Filled 2018-09-08: qty 500
  Filled 2018-09-08: qty 250

## 2018-09-08 MED ORDER — LACTATED RINGERS IV BOLUS
1000.0000 mL | Freq: Once | INTRAVENOUS | Status: AC
  Administered 2018-09-08: 1000 mL via INTRAVENOUS

## 2018-09-08 MED ORDER — SODIUM CHLORIDE 0.9 % IV BOLUS
1000.0000 mL | Freq: Once | INTRAVENOUS | Status: AC
  Administered 2018-09-08: 1000 mL via INTRAVENOUS

## 2018-09-08 MED ORDER — PIPERACILLIN-TAZOBACTAM 3.375 G IVPB
3.3750 g | Freq: Three times a day (TID) | INTRAVENOUS | Status: DC
  Administered 2018-09-08 – 2018-09-10 (×7): 3.375 g via INTRAVENOUS
  Filled 2018-09-08 (×7): qty 50

## 2018-09-08 MED ORDER — CHLORHEXIDINE GLUCONATE CLOTH 2 % EX PADS
6.0000 | MEDICATED_PAD | Freq: Every day | CUTANEOUS | Status: DC
  Administered 2018-09-08 – 2018-09-11 (×5): 6 via TOPICAL

## 2018-09-08 MED ORDER — MIDAZOLAM HCL 2 MG/2ML IJ SOLN
2.0000 mg | INTRAMUSCULAR | Status: DC | PRN
  Administered 2018-09-08 – 2018-09-10 (×2): 2 mg via INTRAVENOUS

## 2018-09-08 MED ORDER — CHLORHEXIDINE GLUCONATE 0.12% ORAL RINSE (MEDLINE KIT)
15.0000 mL | Freq: Two times a day (BID) | OROMUCOSAL | Status: DC
  Administered 2018-09-08 – 2018-09-12 (×10): 15 mL via OROMUCOSAL

## 2018-09-08 MED ORDER — ACETAMINOPHEN 160 MG/5ML PO SOLN
650.0000 mg | ORAL | Status: DC | PRN
  Administered 2018-09-12: 650 mg
  Filled 2018-09-08: qty 20.3

## 2018-09-08 NOTE — Progress Notes (Signed)
Subjective: The patient is intubated.  She is much more responsive this morning.  Objective: Vital signs in last 24 hours: Temp:  [36.2 C-38.6 C] 37.2 C (09/07 0430) Pulse Rate:  [37-134] 76 (09/07 0700) Resp:  [14-40] 21 (09/07 0700) BP: (84-150)/(56-108) 96/71 (09/07 0700) SpO2:  [82 %-100 %] 92 % (09/07 0841) Arterial Line BP: (78-116)/(53-72) 78/72 (09/07 0700) FiO2 (%):  [90 %-100 %] 100 % (09/07 0841) Weight:  [75 kg] 75 kg (09/06 2237) Estimated body mass index is 26.69 kg/m as calculated from the following:   Height as of this encounter: 5\' 6"  (1.676 m).   Weight as of this encounter: 75 kg.   Intake/Output from previous day: 09/06 0701 - 09/07 0700 In: 3895.4 [I.V.:2327.8; NG/GT:50; IV Piggyback:1517.6] Out: 778 [Urine:695; Drains:83] Intake/Output this shift: Total I/O In: -  Out: 300 [Urine:300]  Physical exam Glasgow Coma Scale 7 intubated, [id number].  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  Her ventriculostomy is patent.  Lab Results: Recent Labs    09/05/2018 1928  09/08/18 0539 09/08/18 0811  WBC 31.6*  --   --  19.6*  HGB 14.5   < > 15.0 13.9  HCT 46.7*   < > 44.0 43.3  PLT 298  --   --  202   < > = values in this interval not displayed.   BMET Recent Labs    09/15/2018 1911 09/25/2018 1928  09/08/18 0020 09/08/18 0539  NA 138 134*   < > 139 143  K 3.9 4.5   < > 4.1 4.3  CL 106 101  --   --   --   CO2  --  11*  --   --   --   GLUCOSE 406* 380*  --   --   --   BUN 22* 19  --   --   --   CREATININE 1.30* 1.55*  --   --   --   CALCIUM  --  7.9*  --   --   --    < > = values in this interval not displayed.    Studies/Results: Ct Head Wo Contrast  Result Date: 09/13/2018 CLINICAL DATA:  Altered mental status. EXAM: CT HEAD WITHOUT CONTRAST TECHNIQUE: Contiguous axial images were obtained from the base of the skull through the vertex without intravenous contrast. COMPARISON:  Brain MR dated 12/15/2014. FINDINGS: Brain: Large amount of bilateral  subarachnoid hemorrhage, most pronounced in the basilar cisterns, extending into the 4th ventricle. Interval mildly enlarged ventricles. Vascular: No hyperdense vessel or unexpected calcification. Skull: Normal. Negative for fracture or focal lesion. Sinuses/Orbits: Tiny amount of fluid in the right maxillary sinus. Unremarkable orbits. Other: Endotracheal and orogastric tubes. IMPRESSION: 1. Large amount of bilateral subarachnoid hemorrhage, most pronounced in the basilar cisterns, extending into the 4th ventricle. 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 2. Mild hydrocephalus. 3. Minimal acute right maxillary sinusitis. Critical Value/emergent results were called by telephone at the time of interpretation on 10/01/2018 at 8:29 pm to Dr. Charlesetta Shanks , who verbally acknowledged these results. Electronically Signed   By: Claudie Revering M.D.   On: 09/28/2018 20:31   Dg Chest Portable 1 View  Result Date: 09/11/2018 CLINICAL DATA:  Altered mental status, post intubation EXAM: PORTABLE CHEST 1 VIEW COMPARISON:  Portable exam 1925 hours compared to 10/10/2007 FINDINGS: Tip of endotracheal tube 6 mm from carina, recommend withdrawal 1-2 cm. Nasogastric  tube extends into stomach. Numerous EKG leads project over chest. Normal heart size and mediastinal contours. BILATERAL perihilar infiltrates greater on LEFT, could represent pneumonia or aspiration. No pleural effusion or pneumothorax. Bones unremarkable. IMPRESSION: Tip of endotracheal tube 6 mm from carina, recommend withdrawal 1-2 cm. BILATERAL pulmonary infiltrates, most severe in the LEFT upper lobe, question aspiration versus pneumonia Findings called to Dr. Donnald GarrePfeiffer on 09/30/2018 at 1938 hrs. Electronically Signed   By: Ulyses SouthwardMark  Boles M.D.   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  The patient is doing much better neurologically, but  unfortunately most worse from the pulmonary point of view.  Critical care/pulmonology is managing her ventilator.  If the patient can be stabilized we will work her up further for source of her hemorrhage and possible coiling.  LOS: 1 day     Cristi LoronJeffrey D Fines Kimberlin 09/08/2018, 8:53 AM

## 2018-09-08 NOTE — Progress Notes (Signed)
[place] Progress Note Patient Name: Joann Johnson DOB: [birth year] MRN: [id number]   Date of Service  09/08/2018  HPI/Events of Note  ABG on 100%/PRVC 26/TV 510/P 12 = 7.259/35.2/84.0  eICU Interventions  Will order: 1. Increase PRVC rate to 31.  2. Repeat ABG at 5 AM.     Intervention Category Major Interventions: Acid-Base disturbance - evaluation and management;Respiratory failure - evaluation and management  Sommer,Steven Cornelia Copa 09/08/2018, 3:39 AM

## 2018-09-08 NOTE — Progress Notes (Signed)
[place] Progress Note Patient Name: Joann Johnson DOB: 1960-07-14 MRN: [id number]   Date of Service  09/08/2018  HPI/Events of Note  Multiple issues: 1. Lactic Acid > 11.0 and 2. Agitation.  eICU Interventions  Will order: 1. Bolus with 0.9 NaCl 1 liter IV over 1 hour now.  2. Increase ceiling on Fentanyl IV infusion to 400 mcg/hour.  3. Fentanyl 50-100 mcg IV Q 1 hour PRN agitation or sedation.      Intervention Category Major Interventions: Acid-Base disturbance - evaluation and management;Delirium, psychosis, severe agitation - evaluation and management  Sasha Rueth Eugene 09/08/2018, 1:26 AM

## 2018-09-08 NOTE — Progress Notes (Signed)
[place] Progress Note Patient Name: Joann Johnson DOB: [birth year] MRN: [id number]   Date of Service  09/08/2018  HPI/Events of Note  Multiple issues: 1. ABG on 70%/PRVC 35/TV 410/P 10 = 7.378/30.8/91.0 and 2. Lactic acid = 4.5 --> 3.2. Lactic acid clearing.   eICU Interventions  Continue present management.      Intervention Category Major Interventions: Respiratory failure - evaluation and management;Acid-Base disturbance - evaluation and management  Sommer,Steven Eugene 09/08/2018, 9:19 PM

## 2018-09-08 NOTE — Progress Notes (Signed)
[place] Progress Note Patient Name: Joann Johnson DOB: 1960-05-28 MRN: [id number]   Date of Service  09/08/2018  HPI/Events of Note  Multiple issues: 1. Lactic Acid = >11.0 --> 6.3 --> 4.8. Lactic acid continues to clear and 2. ABG on 90%/PRVC 31/ TV 470/P 12 = 7.18/35.9/61.0/13.1  eICU Interventions  Will order: 1. NaHCO3 100 meq IV now.  2. Repeat ABG at 9 AM.     Intervention Category Major Interventions: Acid-Base disturbance - evaluation and management;Respiratory failure - evaluation and management  Laketha Leopard Eugene 09/08/2018, 5:46 AM

## 2018-09-08 NOTE — Progress Notes (Signed)
Initial Nutrition Assessment  RD working remotely.  DOCUMENTATION CODES:   Not applicable  INTERVENTION:   Tube feeding recommendations: - Vital High Protein @ 25 ml/hr (600 ml/day) - Pro-stat 60 ml BID  Recommended tube feeding regimen provides 1000 kcal, 113 grams of protein, and 502 ml of H2O.   Recommended tube feeding regimen and current propofol provides 1832 total kcal (101% of needs).  NUTRITION DIAGNOSIS:   Inadequate oral intake related to inability to eat as evidenced by NPO status.  GOAL:   Patient will meet greater than or equal to 90% of their needs  MONITOR:   Vent status, Labs, Weight trends, Skin, I & O's  REASON FOR ASSESSMENT:   Ventilator    ASSESSMENT:   58 year old female who presented to the ED on 9/06 with AMS. Pt required intubation in the ED. PMH of HTN, HLD, osteoporosis. CT head demonstrated diffuse subarachnoid hemorrhage and hydrocephalus. Pt required emergent ventriculostomy placement.   Pt with worsening hypoxemia and hypotension today. Per CCM, pt now with ARDS and septic shock due to suspected aspiration event. Pt also with AKI.  OG tube in place, currently clamped.  Per weight history in chart, weigh tup 10 kg over the last 10 months.  Patient is currently intubated on ventilator support MV: 14.1 L/min Temp (24hrs), Avg:99.8 F (37.7 C), Min:97.1 F (36.2 C), Max:101.5 F (38.6 C) BP (a-line): 100/51 MAP (a-line): 67  Drips: Propofol: 31.5 ml/hr (provides 832 kcal daily from lipid) Fentanyl: 40 ml/hr Levophed: 15 ml/hr Neosynephrine: 48.8 ml/hr  Medications reviewed and include: SSI q 4 hours, Protonix, IV abx  Labs reviewed: calcium ionized 1.08, triglycerides 303 CBG's: 132-367 x 24 hours  UOP: 695 ml x 24 hours Ventriculostomy: 83 ml x 24 hours I/O's: +3.9 L since admit  NUTRITION - FOCUSED PHYSICAL EXAM:  Unable to complete at this time. RD working remotely.  Diet Order:   Diet Order            Diet NPO  time specified  Diet effective now              EDUCATION NEEDS:   No education needs have been identified at this time  Skin:  Skin Assessment: Reviewed RN Assessment  Last BM:  no documented BM  Height:   Ht Readings from Last 1 Encounters:  09/08/18 5\' 6"  (1.676 m)    Weight:   Wt Readings from Last 1 Encounters:  09/15/2018 75 kg    Ideal Body Weight:  59.1 kg  BMI:  Body mass index is 26.69 kg/m.  Estimated Nutritional Needs:   Kcal:  1808  Protein:  100-115 grams  Fluid:  >/= 1.8 L    Gaynell Face, MS, RD, LDN Inpatient Clinical Dietitian Pager: [phone] Weekend/After Hours: [phone]

## 2018-09-08 NOTE — Progress Notes (Signed)
NAME:  Joann Johnson, MRN:  [id number], DOB:  1960/04/28, LOS: 1 ADMISSION DATE:  09/24/2018, CONSULTATION DATE: 9/6 REFERRING MD:  Johnney Killian, CHIEF COMPLAINT:  Comatose   Brief History   58 year old woman with a history of HTN, HLD, osteoporosis found unconscious today, found to have SAH and hydrocephalus. PCCM consulted for vent management.  History of present illness   [name] is a 58 year old female with HTN, HLD who was found down. Last normal 11:00 am on day of admission. Intubated on arrival to ED. Comatose, hypoxic.   Ventriculostomy (R frontal) placed via Burr hole.   Hypoxemic to 70s on EMS arrival, they were unable to intubate due to jaw clenching. Labs notable for Lactic acidosis, leukocytosis.  Per husband, had been normal self this am.  No complaints of any signs of illness prior to his leaving her this am.   Past Medical History  HTN [place] [facility] Events   9/6 Admitted to [facility] for [facility], hydrocephalus s/p ventriculostomy 9/7 Worsening hypoxemia and hypotension  Consults:  Neurosurgery  PCCM  Procedures:  Ventriculostomy 9/6 PM  CVC 9/7  Significant Diagnostic Tests:  CT head IMPRESSION: 1. Large amount of bilateral subarachnoid hemorrhage, most pronounced in the basilar cisterns, extending into the 4th ventricle. 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 2. Mild hydrocephalus. 3. Minimal acute right maxillary sinusitis.   CXR:  BILATERAL pulmonary infiltrates, most severe in the LEFT upper lobe, question aspiration versus pneumonia Micro Data:  BCx 9/7 Resp Cx 9/7  Antimicrobials:  Zosyn 9/6 Vanc 9/6  Interim history/subjective:  S/p ventriculostomy and CVC placement (discussed with overnight NP rationale for femoral line placement; c-collar limited access and femoral access used in setting of emergent necessity). Requiring high dose Neo this morning and hypoxemia on FIO2 90%  and PEEP 12  Objective   Blood pressure 96/71, pulse 76, temperature 99 F (37.2 C), resp. rate (!) 21, height 5\' 6"  (1.676 m), weight 75 kg, last menstrual period 01/02/2000, SpO2 (!) 83 %.    Vent Mode: PRVC FiO2 (%):  [90 %-100 %] 90 % Set Rate:  [14 bmp-31 bmp] 31 bmp Vt Set:  [470 mL-510 mL] 470 mL PEEP:  [8 cmH20-12 cmH20] 12 cmH20 Plateau Pressure:  [22 cmH20-26 cmH20] 22 cmH20   Intake/Output Summary (Last 24 hours) at 09/08/2018 0757 Last data filed at 09/08/2018 0700 Gross per 24 hour  Intake 3895.39 ml  Output 778 ml  Net 3117.39 ml   Filed Weights   09/11/2018 2237  Weight: 75 kg   Physical Exam: General: Chronically ill-appearing, sedated HENT: [place], AT, ETT in place Eyes: Non-reactive R pupil 38mm, nonreactive L pupil 84mm, no scleral icterus Respiratory: Anterior rhonchi bilaterally, no wheezing Cardiovascular: RRR, -M/R/G, no JVD GI: BS+, soft, nontender Extremities:-Edema,-tenderness Neuro: Nonpurposeful movement of extremities x 4, does not follow commands GU: Foley in place  CXR 9/7 Worsening bilateral perihilar opacities  Resolved Hospital Problem list     Assessment & Plan:   ARDS: Suspect aspiration event Continue mechanical ventilation per ARDS protocol.  Target TVol 6-8cc/kgIBW Target Plateau Pressure < 30cm H20 Target PaO2 55-65: titrate PEEP/FiO2 per protocol Ventilator associated pneumonia prevention protocol PAD protocol for RASS goal -4: Fentanyl, Propofol, PRN Versed and Fentanyl ABG in 1 hour  Septic shock secondary aspiration pneumonia Transition from Neo to Levophed. MAP goal >65 Continue broad spectrum antibiotics F/u blood and resp cultures  Trend LA  SAH s/p ventriculostomy Decadron, nimodopine, NS per NSG Neuro checks  AKI with metabolic acidosis Monitor UOP Trend BMP Family wishes to pursue dialysis if needed  Best practice:  Diet: NPO Pain/Anxiety/Delirium protocol (if indicated): Fentanyl, propofol VAP protocol (if  indicated): yes DVT prophylaxis: scds GI prophylaxis: ppi Glucose control: CBGq4h, SSI Mobility: bedrest Code Status: DNR Family Communication: Updated husband via phone on 9/7. Disposition: ICU  Labs   CBC: Recent Labs  Lab 09/28/2018 1911 09/26/2018 1928 09/04/2018 2033 09/08/18 0020 09/08/18 0539  WBC  --  31.6*  --   --   --   NEUTROABS  --  25.7*  --   --   --   HGB 16.3* 14.5 14.6 16.7* 15.0  HCT 48.0* 46.7* 43.0 49.0* 44.0  MCV  --  109.1*  --   --   --   PLT  --  298  --   --   --     Basic Metabolic Panel: Recent Labs  Lab 09/03/2018 1911 09/10/2018 1928 09/11/2018 2033 09/08/18 0020 09/08/18 0539  NA 138 134* 141 139 143  K 3.9 4.5 3.3* 4.1 4.3  CL 106 101  --   --   --   CO2  --  11*  --   --   --   GLUCOSE 406* 380*  --   --   --   BUN 22* 19  --   --   --   CREATININE 1.30* 1.55*  --   --   --   CALCIUM  --  7.9*  --   --   --    GFR: Estimated Creatinine Clearance: 40 mL/min (A) (by C-G formula based on SCr of 1.55 mg/dL (H)). Recent Labs  Lab 09/23/2018 1928 09/08/18 0132 09/08/18 0458  WBC 31.6*  --   --   LATICACIDVEN >11.0* 6.3* 4.8*    Liver Function Tests: Recent Labs  Lab 10/01/2018 1928  AST 58*  ALT 26  ALKPHOS 60  BILITOT 1.2  PROT 5.9*  ALBUMIN 3.2*   No results for input(s): LIPASE, AMYLASE in the last 168 hours. No results for input(s): AMMONIA in the last 168 hours.  ABG    Component Value Date/Time   PHART 7.181 (LL) 09/08/2018 0539   PCO2ART 35.9 09/08/2018 0539   PO2ART 61.0 (L) 09/08/2018 0539   HCO3 13.4 (L) 09/08/2018 0539   TCO2 15 (L) 09/08/2018 0539   ACIDBASEDEF 14.0 (H) 09/08/2018 0539   O2SAT 84.0 09/08/2018 0539     Coagulation Profile: Recent Labs  Lab 09/11/2018 2002  INR 1.3*    Cardiac Enzymes: No results for input(s): CKTOTAL, CKMB, CKMBINDEX, TROPONINI in the last 168 hours.  HbA1C: Hgb A1c MFr Bld  Date/Time Value Ref Range Status  09/18/2018 11:20 PM 6.2 (H) 4.8 - 5.6 % Final    Comment:     (NOTE) Pre diabetes:          5.7%-6.4% Diabetes:              >6.4% Glycemic control for   <7.0% adults with diabetes     CBG: Recent Labs  Lab 09/09/2018 1923 09/10/2018 2346 09/08/18 0411  GLUCAP 367* 164* 132*    Critical care time: 45  minutes.    The patient is critically ill with multiple organ systems failure and requires high complexity decision making for assessment and support, frequent evaluation and titration of therapies, application of advanced monitoring technologies and extensive interpretation of  multiple databases.   Critical Care Time devoted to patient care services described in this note is 45 Minutes. This time reflects time of care of this signee Dr. Mechele CollinJane Jorge Retz. This critical care time does not reflect procedure time, or teaching time or supervisory time of PA/NP/Med student/Med Resident etc but could involve care discussion time.  Mechele CollinJane Jakylah Bassinger, M.D. [facility]/Critical [facility] Medicine 09/08/2018 7:58 AM  Pager: [phone] After hours pager: [phone]

## 2018-09-08 NOTE — Procedures (Signed)
Central Venous Catheter Insertion Procedure Note Joann Johnson [id number] 07-18-1960  Procedure: Insertion of Central Venous Catheter Indications: Drug and/or fluid administration  Procedure Details Consent: Risks of procedure as well as the alternatives and risks of each were explained to the (patient/caregiver).  Consent for procedure obtained. Time Out: Verified patient identification, verified procedure, site/side was marked, verified correct patient position, special equipment/implants available, medications/allergies/relevent history reviewed, required imaging and test results available.  Performed  Maximum sterile technique was used including antiseptics, cap, gloves, gown, hand hygiene, mask and sheet. Skin prep: Chlorhexidine; local anesthetic administered A antimicrobial bonded/coated triple lumen catheter was placed in the left femoral vein due to emergent situation using the Seldinger technique.  Evaluation Blood flow good Complications: No apparent complications Patient did tolerate procedure well. Chest X-ray ordered to verify placement.  CXR: pending.  Cristal Generous 09/08/2018, 6:50 AM   Eliseo Gum MSN, AGACNP-BC [place] [phone] If no answer, [phone] 09/08/2018, 6:51 AM

## 2018-09-08 NOTE — Procedures (Signed)
Arterial Catheter Insertion Procedure Note EMONEE WINKOWSKI [id number] 09-30-1960  Procedure: Insertion of Arterial Catheter  Indications: Blood pressure monitoring  Procedure Details Consent: Unable to obtain consent because of altered level of consciousness. Time Out: Verified patient identification, verified procedure, site/side was marked, verified correct patient position, special equipment/implants available, medications/allergies/relevent history reviewed, required imaging and test results available.  Performed  Maximum sterile technique was used including antiseptics, cap, gloves, gown, hand hygiene, mask and sheet. Skin prep: Chlorhexidine; local anesthetic administered 20 gauge catheter was inserted into right radial artery using the Seldinger technique. ULTRASOUND GUIDANCE USED: NO Evaluation Blood flow good; BP tracing good. Complications: No apparent complications.   Romeo Apple 09/08/2018

## 2018-09-08 NOTE — Progress Notes (Signed)
Pt transported from ER TRAC to 4Z66 with no complications.

## 2018-09-09 ENCOUNTER — Inpatient Hospital Stay: Payer: Self-pay

## 2018-09-09 ENCOUNTER — Inpatient Hospital Stay (HOSPITAL_COMMUNITY): Payer: BC Managed Care – PPO

## 2018-09-09 ENCOUNTER — Other Ambulatory Visit: Payer: Self-pay

## 2018-09-09 DIAGNOSIS — J690 Pneumonitis due to inhalation of food and vomit: Secondary | ICD-10-CM

## 2018-09-09 DIAGNOSIS — J9601 Acute respiratory failure with hypoxia: Secondary | ICD-10-CM

## 2018-09-09 LAB — CBC
HCT: 37.6 % (ref 36.0–46.0)
Hemoglobin: 12.9 g/dL (ref 12.0–15.0)
MCH: 34.5 pg — ABNORMAL HIGH (ref 26.0–34.0)
MCHC: 34.3 g/dL (ref 30.0–36.0)
MCV: 100.5 fL — ABNORMAL HIGH (ref 80.0–100.0)
Platelets: 172 10*3/uL (ref 150–400)
RBC: 3.74 MIL/uL — ABNORMAL LOW (ref 3.87–5.11)
RDW: 13.1 % (ref 11.5–15.5)
WBC: 17 10*3/uL — ABNORMAL HIGH (ref 4.0–10.5)
nRBC: 0.3 % — ABNORMAL HIGH (ref 0.0–0.2)

## 2018-09-09 LAB — GLUCOSE, CAPILLARY
Glucose-Capillary: 167 mg/dL — ABNORMAL HIGH (ref 70–99)
Glucose-Capillary: 174 mg/dL — ABNORMAL HIGH (ref 70–99)
Glucose-Capillary: 180 mg/dL — ABNORMAL HIGH (ref 70–99)
Glucose-Capillary: 196 mg/dL — ABNORMAL HIGH (ref 70–99)
Glucose-Capillary: 200 mg/dL — ABNORMAL HIGH (ref 70–99)
Glucose-Capillary: 221 mg/dL — ABNORMAL HIGH (ref 70–99)

## 2018-09-09 LAB — BASIC METABOLIC PANEL
Anion gap: 13 (ref 5–15)
BUN: 18 mg/dL (ref 6–20)
CO2: 15 mmol/L — ABNORMAL LOW (ref 22–32)
Calcium: 7.5 mg/dL — ABNORMAL LOW (ref 8.9–10.3)
Chloride: 113 mmol/L — ABNORMAL HIGH (ref 98–111)
Creatinine, Ser: 0.85 mg/dL (ref 0.44–1.00)
GFR calc Af Amer: >60 mL/min (ref >60)
GFR calc non Af Amer: >60 mL/min (ref >60)
Glucose, Bld: 226 mg/dL — ABNORMAL HIGH (ref 70–99)
Potassium: 3.8 mmol/L (ref 3.5–5.1)
Sodium: 141 mmol/L (ref 135–145)

## 2018-09-09 LAB — POCT I-STAT 7, (LYTES, BLD GAS, ICA,H+H)
Acid-base deficit: 3 mmol/L — ABNORMAL HIGH (ref 0.0–2.0)
Acid-base deficit: 6 mmol/L — ABNORMAL HIGH (ref 0.0–2.0)
Bicarbonate: 19.4 mmol/L — ABNORMAL LOW (ref 20.0–28.0)
Bicarbonate: 17.7 mmol/L — ABNORMAL LOW (ref 20.0–28.0)
Calcium, Ion: 1.09 mmol/L — ABNORMAL LOW (ref 1.15–1.40)
Calcium, Ion: 1.08 mmol/L — ABNORMAL LOW (ref 1.15–1.40)
HCT: 36 % (ref 36.0–46.0)
HCT: 32 % — ABNORMAL LOW (ref 36.0–46.0)
Hemoglobin: 12.2 g/dL (ref 12.0–15.0)
Hemoglobin: 10.9 g/dL — ABNORMAL LOW (ref 12.0–15.0)
O2 Saturation: 100 %
O2 Saturation: 96 %
Patient temperature: 99
Patient temperature: 99.2
Potassium: 3.5 mmol/L (ref 3.5–5.1)
Potassium: 3.4 mmol/L — ABNORMAL LOW (ref 3.5–5.1)
Sodium: 142 mmol/L (ref 135–145)
Sodium: 144 mmol/L (ref 135–145)
TCO2: 20 mmol/L — ABNORMAL LOW (ref 22–32)
TCO2: 18 mmol/L — ABNORMAL LOW (ref 22–32)
pCO2 arterial: 28.2 mmHg — ABNORMAL LOW (ref 32.0–48.0)
pCO2 arterial: 28.1 mmHg — ABNORMAL LOW (ref 32.0–48.0)
pH, Arterial: 7.447 (ref 7.350–7.450)
pH, Arterial: 7.408 (ref 7.350–7.450)
pO2, Arterial: 161 mmHg — ABNORMAL HIGH (ref 83.0–108.0)
pO2, Arterial: 79 mmHg — ABNORMAL LOW (ref 83.0–108.0)

## 2018-09-09 LAB — MAGNESIUM
Magnesium: 1.8 mg/dL (ref 1.7–2.4)
Magnesium: 2 mg/dL (ref 1.7–2.4)
Magnesium: 2 mg/dL (ref 1.7–2.4)

## 2018-09-09 LAB — PHOSPHORUS
Phosphorus: 1.5 mg/dL — ABNORMAL LOW (ref 2.5–4.6)
Phosphorus: 1.6 mg/dL — ABNORMAL LOW (ref 2.5–4.6)

## 2018-09-09 LAB — HIV ANTIBODY (ROUTINE TESTING W REFLEX): HIV Screen 4th Generation wRfx: NONREACTIVE

## 2018-09-09 MED ORDER — INSULIN ASPART 100 UNIT/ML ~~LOC~~ SOLN
3.0000 [IU] | SUBCUTANEOUS | Status: DC
  Administered 2018-09-09 (×3): 6 [IU] via SUBCUTANEOUS
  Administered 2018-09-10: 3 [IU] via SUBCUTANEOUS
  Administered 2018-09-10 (×4): 6 [IU] via SUBCUTANEOUS
  Administered 2018-09-11 (×2): 3 [IU] via SUBCUTANEOUS
  Administered 2018-09-12: 08:00:00 9 [IU] via SUBCUTANEOUS
  Administered 2018-09-12: 3 [IU] via SUBCUTANEOUS
  Administered 2018-09-12: 16:00:00 6 [IU] via SUBCUTANEOUS

## 2018-09-09 MED ORDER — PANTOPRAZOLE SODIUM 40 MG PO PACK
40.0000 mg | PACK | ORAL | Status: DC
  Administered 2018-09-09 – 2018-09-11 (×3): 40 mg
  Filled 2018-09-09 (×3): qty 20

## 2018-09-09 MED ORDER — PRO-STAT SUGAR FREE PO LIQD
30.0000 mL | Freq: Two times a day (BID) | ORAL | Status: DC
  Administered 2018-09-09: 30 mL
  Filled 2018-09-09: qty 30

## 2018-09-09 MED ORDER — CHLORHEXIDINE GLUCONATE 0.12 % MT SOLN
OROMUCOSAL | Status: AC
  Filled 2018-09-09: qty 15

## 2018-09-09 MED ORDER — VITAL HIGH PROTEIN PO LIQD: 1000.0000 mL | ORAL | Status: DC

## 2018-09-09 NOTE — Progress Notes (Signed)
Subjective: The patient is heavily sedated and intubated.  She is in no apparent distress.  Objective: Vital signs in last 24 hours: Temp:  [36.1 C-37.2 C] 37.2 C (09/08 0400) Pulse Rate:  [54-74] 54 (09/08 0757) Resp:  [26-35] 35 (09/08 0757) BP: (82-141)/(63-81) 132/73 (09/08 0700) SpO2:  [90 %-100 %] 98 % (09/08 0757) Arterial Line BP: (82-158)/(51-98) 158/62 (09/08 0700) FiO2 (%):  [40 %-100 %] 40 % (09/08 0757) Weight:  [71.8 kg] 71.8 kg (09/08 0500) Estimated body mass index is 25.55 kg/m as calculated from the following:   Height as of this encounter: 5\' 6"  (1.676 m).   Weight as of this encounter: 71.8 kg.   Intake/Output from previous day: 09/07 0701 - 09/08 0700 In: 4200.8 [I.V.:2871.8; IV Piggyback:1329] Out: 1697.5 [Urine:1620; Drains:77.5] Intake/Output this shift: No intake/output data recorded.  Physical exam Glascow coma scale 3 heavily sedated and intubated.  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  Her right pupil is unremarkable.  Her ventriculostomy is patent.  Lab Results: Recent Labs    09/08/18 0811  09/09/18 0538 09/09/18 0620  WBC 19.6*  --   --  17.0*  HGB 13.9   < > 12.2 12.9  HCT 43.3   < > 36.0 37.6  PLT 202  --   --  172   < > = values in this interval not displayed.   BMET Recent Labs    09/08/18 0811  09/09/18 0509 09/09/18 0538  NA 143   < > 141 142  K 4.4   < > 3.8 3.5  CL 110  --  113*  --   CO2 20*  --  15*  --   GLUCOSE 182*  --  226*  --   BUN 17  --  18  --   CREATININE 1.04*  --  0.85  --   CALCIUM 6.6*  --  7.5*  --    < > = values in this interval not displayed.    Studies/Results: Ct Head Wo Contrast  Result Date: 09/05/2018 CLINICAL DATA:  Altered mental status. EXAM: CT HEAD WITHOUT CONTRAST TECHNIQUE: Contiguous axial images were obtained from the base of the skull through the vertex without intravenous contrast. COMPARISON:  Brain MR dated 12/15/2014. FINDINGS: Brain: Large amount of bilateral subarachnoid  hemorrhage, most pronounced in the basilar cisterns, extending into the 4th ventricle. Interval mildly enlarged ventricles. Vascular: No hyperdense vessel or unexpected calcification. Skull: Normal. Negative for fracture or focal lesion. Sinuses/Orbits: Tiny amount of fluid in the right maxillary sinus. Unremarkable orbits. Other: Endotracheal and orogastric tubes. IMPRESSION: 1. Large amount of bilateral subarachnoid hemorrhage, most pronounced in the basilar cisterns, extending into the 4th ventricle. 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 2. Mild hydrocephalus. 3. Minimal acute right maxillary sinusitis. Critical Value/emergent results were called by telephone at the time of interpretation on 10/01/2018 at 8:29 pm to Dr. Arby BarretteMARCY PFEIFFER , who verbally acknowledged these results. Electronically Signed   By: Beckie SaltsSteven  Reid M.D.   On: 2018/02/07 20:31   Dg Chest Port 1 View  Result Date: 09/08/2018 CLINICAL DATA:  Patient found down with diffuse subarachnoid hemorrhage 2018/02/07. EXAM: PORTABLE CHEST 1 VIEW COMPARISON:  Single-view of the chest 2018/02/07. FINDINGS: Bilateral airspace disease predominantly in a perihilar distribution and has markedly worsened since yesterday's study. Heart size is mildly enlarged. No pneumothorax or pleural effusion. IMPRESSION: Marked worsening in extensive bilateral airspace disease could  be due to progressive edema or pneumonia. Electronically Signed   By: Inge Rise M.D.   On: 09/08/2018 09:51   Dg Chest Portable 1 View  Result Date: 09-21-2018 CLINICAL DATA:  Altered mental status, post intubation EXAM: PORTABLE CHEST 1 VIEW COMPARISON:  Portable exam 1925 hours compared to 10/10/2007 FINDINGS: Tip of endotracheal tube 6 mm from carina, recommend withdrawal 1-2 cm. Nasogastric tube extends into stomach. Numerous EKG leads project over chest. Normal heart size and mediastinal contours. BILATERAL perihilar  infiltrates greater on LEFT, could represent pneumonia or aspiration. No pleural effusion or pneumothorax. Bones unremarkable. IMPRESSION: Tip of endotracheal tube 6 mm from carina, recommend withdrawal 1-2 cm. BILATERAL pulmonary infiltrates, most severe in the LEFT upper lobe, question aspiration versus pneumonia Findings called to Dr. Johnney Killian on 2018-09-21 at 1938 hrs. Electronically Signed   By: Lavonia Dana M.D.   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  I discussed the patient with Dr. Kathyrn Sheriff who will take over her care.  If she stabilized from the pulmonary point of view she will likely get an arteriogram and possibly aneurysmal coiling.  LOS: 2 days     Ophelia Charter 09/09/2018, 8:26 AM

## 2018-09-09 NOTE — Progress Notes (Signed)
  NEUROSURGERY PROGRESS NOTE   No issues overnight. Pt remains intubated, sedated. Hx reviewed in EMR and with Dr. Arnoldo Morale and Dr. Halford Chessman. Pt presented unresponsive requiring intubation with large fixed unresponsive left pupil. Apparently did demonstrate imprCTH demonstrates ovement in neurologic exam after placement of EVD. Pts respiratory status has improved considerably overnight.  IMAGING: CTH demonstrates basal SAH with HCP.  IMPRESSION:  58 y.o. female Hunt-Hess 5 Fisher 3/4 with likely aspiration pneumonia and ARDS precluding acute workup/treatment of likely aneurysm. ARDS has significantly improved.  PLAN: - Spoke with Dr. Halford Chessman, if cardiopulmonary status continues to improve will be stable for angiogram and treatment of aneurysm tomorrow. - Cont current supportive care.

## 2018-09-09 NOTE — Progress Notes (Signed)
Nutrition Follow-up  RD working remotely.  DOCUMENTATION CODES:   Not applicable  INTERVENTION:   Tube feeding: - Vital High Protein @ 55 ml/hr (1320 ml/day) via OG tube  Tube feeding regimen provides 1320 kcal, 116 grams of protein, and 1103 ml of H2O.   Tube feeding regimen and current propofol provides 1616 total kcal (103% of needs)  NUTRITION DIAGNOSIS:   Inadequate oral intake related to inability to eat as evidenced by NPO status.  Ongoing, being addressed via initiation of TF  GOAL:   Patient will meet greater than or equal to 90% of their needs  Met via TF  MONITOR:   I & O's, Vent status, Labs, Weight trends, TF tolerance  REASON FOR ASSESSMENT:   Ventilator    ASSESSMENT:   58 year old female who presented to the ED on 9/06 with AMS. Pt required intubation in the ED. PMH of HTN, HLD, osteoporosis. CT head demonstrated diffuse subarachnoid hemorrhage and hydrocephalus. Pt required emergent ventriculostomy placement.  RD consulted for tube feeding initiation and management.  Per Neurosurgery note, "if cardiopulmonary status continues to improve will be stable for angiogram and treatment of aneurysm tomorrow."  MD has ordered Adult ICU Tube Feeding Protocol. RD will adjust to better meet pt's needs.  OG tube remains in place, currently clamped.  Weight down 7 lbs from 9/06 to 9/08.  Patient is currently intubated on ventilator support MV: 11.3 L/min Temp (24hrs), Avg:98.8 F (37.1 C), Min:98.2 F (36.8 C), Max:99.2 F (37.3 C) BP (a-line): 102/50 MAP (a-line): 66  Drips: Propofol: 11.2 ml/hr (provides 296 kcal daily from lipid) Fentanyl: 25 ml/hr Levophed: 1.9 ml/hr Vasopressin: 15 ml/hr  Medications reviewed and include: SSI, Protonix, IV abx  Labs reviewed: phosphorus 1.5, potassium 3.4, calcium ionized 1.08 CBG's: 170-221 x 24 hours  UOP: 1620 ml x 24 hours Ventriculostomy: 77.5 ml x 24 hours I/O's: +6.1 L since admit  NUTRITION  - FOCUSED PHYSICAL EXAM:  Unable to complete at this time. RD working remotely.  Diet Order:   Diet Order            Diet NPO time specified  Diet effective now              EDUCATION NEEDS:   No education needs have been identified at this time  Skin:  Skin Assessment: Reviewed RN Assessment  Last BM:  no documented BM  Height:   Ht Readings from Last 1 Encounters:  09/08/18 _0  (1.676 m)    Weight:   Wt Readings from Last 1 Encounters:  09/09/18 71.8 kg    Ideal Body Weight:  59.1 kg  BMI:  Body mass index is 25.55 kg/m.  Estimated Nutritional Needs:   Kcal:  1574  Protein:  100-115 grams  Fluid:  >/= 1.8 L    Gaynell Face, MS, RD, LDN Inpatient Clinical Dietitian Pager: [phone] Weekend/After Hours: [phone]

## 2018-09-09 NOTE — Progress Notes (Signed)
Assisted tele visit to patient with multiple family members.  Shiela Bruns Anderson, RN   

## 2018-09-09 NOTE — Progress Notes (Signed)
A-line became hard to pull back on.  Changed dressing and had RN pulled blood for ABG while changing dressing.  After ABG results of:  Results for ALEJANDRIA, WESSELLS (MRN [id number]) as of 09/09/2018 05:47  Ref. Range 09/09/2018 05:38  Sample type Unknown ARTERIAL  pH, Arterial Latest Ref Range: 7.350 - 7.450  7.447  pCO2 arterial Latest Ref Range: 32.0 - 48.0 mmHg 28.2 (L)  pO2, Arterial Latest Ref Range: 83.0 - 108.0 mmHg 161.0 (H)  TCO2 Latest Ref Range: 22 - 32 mmol/L 20 (L)  Acid-base deficit Latest Ref Range: 0.0 - 2.0 mmol/L 3.0 (H)  Bicarbonate Latest Ref Range: 20.0 - 28.0 mmol/L 19.4 (L)  O2 Saturation Latest Units: % 100.0  Patient temperature Unknown 99.0 F  Collection site Unknown ARTERIAL LINE   Changed FIO2 from 70% to 40%.  Will continue to monitor and will alert dayshift RT of issues with A-line.

## 2018-09-09 NOTE — Progress Notes (Signed)
NAME:  Joann Johnson, MRN:  [id number], DOB:  Dec 10, 1960, LOS: 2 ADMISSION DATE:  09/29/2018, CONSULTATION DATE: 9/6 REFERRING MD:  Johnney Killian, CHIEF COMPLAINT:  Comatose   Brief History   58 yo female found unresponsive.  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  Required intubation for airway protection with aspiration pneumonia.  Past Medical History  HTN, [place] [facility] Events   9/6 Admitted to [place] for [facility], hydrocephalus s/p ventriculostomy 9/7 Worsening hypoxemia and hypotension  Consults:  Neurosurgery   Procedures:  Ventriculostomy 9/6 >> ETT 9/6 >> Rt radial A line 9/7 >>  Lt femoral CVL 9/7 >>   Significant Diagnostic Tests:  CT head 9/06 >> large amount b/l SAH extending to 4th ventricle, mild hydrocephalus  Micro Data:  SARS CoV2 9/06 >> negative Blood 9/07 >>   Antimicrobials:  Zosyn 9/6 >>  Vanc 9/6 >> 9/8  Interim history/subjective:  Remains on pressors, sedation, full vent support with increased PEEP.  Objective   Blood pressure 106/65, pulse (!) 54, temperature 99 F (37.2 C), temperature source Oral, resp. rate (!) 35, height 5\' 6"  (1.676 m), weight 71.8 kg, last menstrual period 01/02/2000, SpO2 99 %.    Vent Mode: PRVC FiO2 (%):  [40 %-70 %] 40 % Set Rate:  [35 bmp] 35 bmp Vt Set:  [410 mL] 410 mL PEEP:  [10 WFU93-23 cmH20] 10 cmH20 Plateau Pressure:  [21 cmH20-25 cmH20] 22 cmH20   Intake/Output Summary (Last 24 hours) at 09/09/2018 0855 Last data filed at 09/09/2018 0800 Gross per 24 hour  Intake 4200.76 ml  Output 1395.5 ml  Net 2805.26 ml   Filed Weights   09/21/2018 2237 09/09/18 0500  Weight: 75 kg 71.8 kg   Physical Exam:  General - sedated Eyes - Lt pupil bigger than Rt ENT - ETT in place, C collar on Cardiac - regular rate/rhythm, no murmur Chest - b/l rhonchi Abdomen - soft, non tender, + bowel sounds Extremities - no cyanosis, clubbing, or edema Skin - no rashes Neuro - RASS -4   Resolved Hospital  Problem list   Lactic acidosis, AKI  Assessment & Plan:   Acute hypoxic respiratory failure from aspiration pneumonia. - full vent support - goal SpO2 > 92% - f/u CXR, ABG  Sepsis from aspiration pneumonia. - day 3 of zosyn; will d/c vancomycin - pressors to keep MAP > 65  SAH s/p ventriculostomy. - neurosurgery planning angiogram later this week when more stable - continue decadron, nimodipine  Acute metabolic encephalopathy. - RASS goal -1 - f/u triglyceride levels  Steroid induced hyperglycemia. - SSI  Best practice:  Diet: tube feeds DVT prophylaxis: SCDs GI prophylaxis: protonix Mobility: bedrest Code Status: DNR Disposition: ICU  Labs    CMP Latest Ref Rng & Units 09/09/2018 09/09/2018 09/08/2018  Glucose 70 - 99 mg/dL - 226(H) -  BUN 6 - 20 mg/dL - 18 -  Creatinine 0.44 - 1.00 mg/dL - 0.85 -  Sodium 135 - 145 mmol/L 142 141 144  Potassium 3.5 - 5.1 mmol/L 3.5 3.8 4.0  Chloride 98 - 111 mmol/L - 113(H) -  CO2 22 - 32 mmol/L - 15(L) -  Calcium 8.9 - 10.3 mg/dL - 7.5(L) -  Total Protein 6.5 - 8.1 g/dL - - -  Total Bilirubin 0.3 - 1.2 mg/dL - - -  Alkaline Phos 38 - 126 U/L - - -  AST 15 - 41 U/L - - -  ALT 0 - 44 U/L - - -  CBC Latest Ref Rng & Units 09/09/2018 09/09/2018 09/08/2018  WBC 4.0 - 10.5 K/uL 17.0(H) - -  Hemoglobin 12.0 - 15.0 g/dL 16.112.9 09.612.2 04.512.9  Hematocrit 36.0 - 46.0 % 37.6 36.0 38.0  Platelets 150 - 400 K/uL 172 - -   ABG    Component Value Date/Time   PHART 7.447 09/09/2018 0538   PCO2ART 28.2 (L) 09/09/2018 0538   PO2ART 161.0 (H) 09/09/2018 0538   HCO3 19.4 (L) 09/09/2018 0538   TCO2 20 (L) 09/09/2018 0538   ACIDBASEDEF 3.0 (H) 09/09/2018 0538   O2SAT 100.0 09/09/2018 0538   CBG (last 3)  Recent Labs    09/08/18 2347 09/09/18 0342 09/09/18 0804  GLUCAP 192* 200* 221*    D/w Dr. Conchita ParisNundkumar.  CC time 36 minutes  Coralyn HellingVineet Madhavi Hamblen, MD [facility] 09/09/2018, 9:23 AM

## 2018-09-09 NOTE — Progress Notes (Signed)
Assisted tele visit to patient with family member.  Raelan Burgoon R, RN  

## 2018-09-09 NOTE — Progress Notes (Signed)
Inpatient Diabetes Program Recommendations  AACE/ADA: New Consensus Statement on Inpatient Glycemic Control (2015)  Target Ranges:  Prepandial:   less than 140 mg/dL      Peak postprandial:   less than 180 mg/dL (1-2 hours)      Critically ill patients:  140 - 180 mg/dL   Lab Results  Component Value Date   GLUCAP 180 (H) 09/09/2018   HGBA1C 6.2 (H) 09/27/2018    Review of Glycemic Control Results for MIYEKO, MAHLUM (MRN [id number]) as of 09/09/2018 12:58  Ref. Range 09/08/2018 08:05 09/08/2018 11:33 09/08/2018 15:54 09/08/2018 19:50 09/08/2018 23:47 09/09/2018 03:42 09/09/2018 08:04 09/09/2018 11:22  Glucose-Capillary Latest Ref Range: 70 - 99 mg/dL 151 (H) 182 (H) 174 (H) 170 (H) 192 (H) 200 (H) 221 (H) 180 (H)   Diabetes history: None  Current orders for Inpatient glycemic control:  Novolog 3-9 units Q4 hours  A1c 6.2% on 9/6  Decadron 10 mg Q 6 hours  Inpatient Diabetes Program Recommendations:    Glucose trends elevated. Pt has order to start Tube Feeding (Vital HP 25 ml/hour). Consider starting low dose Levemir 10 units.   Thanks,  Tama Headings RN, MSN, BC-ADM Inpatient Diabetes Coordinator Team Pager [phone] (8a-5p)

## 2018-09-09 NOTE — Progress Notes (Signed)
Chaplain visited as a result of referral from the nurse.  The patient shared that the wife was having difficulty as a result of some family relationships.  The patient showed a good use of spirituality for healing.  The chaplain provided care by listening and conversing with the patient as well as offering prayer.  The chaplain will follow-up as needed.  Brion Aliment Chaplain Resident For questions concerning this note please contact me by pager [phone].

## 2018-09-10 ENCOUNTER — Encounter (HOSPITAL_COMMUNITY): Payer: Self-pay | Admitting: Interventional Radiology

## 2018-09-10 ENCOUNTER — Inpatient Hospital Stay (HOSPITAL_COMMUNITY): Payer: BC Managed Care – PPO | Admitting: Anesthesiology

## 2018-09-10 ENCOUNTER — Encounter (HOSPITAL_COMMUNITY): Admission: EM | Disposition: E | Payer: Self-pay | Source: Home / Self Care | Attending: Neurosurgery

## 2018-09-10 ENCOUNTER — Inpatient Hospital Stay (HOSPITAL_COMMUNITY): Payer: BC Managed Care – PPO

## 2018-09-10 DIAGNOSIS — I639 Cerebral infarction, unspecified: Secondary | ICD-10-CM

## 2018-09-10 HISTORY — PX: IR ANGIO VERTEBRAL SEL VERTEBRAL BILAT MOD SED: IMG5369

## 2018-09-10 HISTORY — PX: IR ANGIOGRAM FOLLOW UP STUDY: IMG697

## 2018-09-10 HISTORY — PX: IR NEURO EACH ADD'L AFTER BASIC UNI LEFT (MS): IMG5373

## 2018-09-10 HISTORY — PX: RADIOLOGY WITH ANESTHESIA: SHX6223

## 2018-09-10 HISTORY — PX: IR ANGIO INTRA EXTRACRAN SEL INTERNAL CAROTID BILAT MOD SED: IMG5363

## 2018-09-10 HISTORY — PX: IR TRANSCATH/EMBOLIZ: IMG695

## 2018-09-10 LAB — BASIC METABOLIC PANEL
Anion gap: 9 (ref 5–15)
BUN: 30 mg/dL — ABNORMAL HIGH (ref 6–20)
CO2: 20 mmol/L — ABNORMAL LOW (ref 22–32)
Calcium: 7.5 mg/dL — ABNORMAL LOW (ref 8.9–10.3)
Chloride: 113 mmol/L — ABNORMAL HIGH (ref 98–111)
Creatinine, Ser: 0.75 mg/dL (ref 0.44–1.00)
GFR calc Af Amer: >60 mL/min (ref >60)
GFR calc non Af Amer: >60 mL/min (ref >60)
Glucose, Bld: 190 mg/dL — ABNORMAL HIGH (ref 70–99)
Potassium: 3.6 mmol/L (ref 3.5–5.1)
Sodium: 142 mmol/L (ref 135–145)

## 2018-09-10 LAB — CBC
HCT: 32.7 % — ABNORMAL LOW (ref 36.0–46.0)
Hemoglobin: 11 g/dL — ABNORMAL LOW (ref 12.0–15.0)
MCH: 34.4 pg — ABNORMAL HIGH (ref 26.0–34.0)
MCHC: 33.6 g/dL (ref 30.0–36.0)
MCV: 102.2 fL — ABNORMAL HIGH (ref 80.0–100.0)
Platelets: 116 10*3/uL — ABNORMAL LOW (ref 150–400)
RBC: 3.2 MIL/uL — ABNORMAL LOW (ref 3.87–5.11)
RDW: 13.2 % (ref 11.5–15.5)
WBC: 13.7 10*3/uL — ABNORMAL HIGH (ref 4.0–10.5)
nRBC: 2 % — ABNORMAL HIGH (ref 0.0–0.2)

## 2018-09-10 LAB — MAGNESIUM
Magnesium: 2.3 mg/dL (ref 1.7–2.4)
Magnesium: 2.4 mg/dL (ref 1.7–2.4)

## 2018-09-10 LAB — GLUCOSE, CAPILLARY
Glucose-Capillary: 108 mg/dL — ABNORMAL HIGH (ref 70–99)
Glucose-Capillary: 130 mg/dL — ABNORMAL HIGH (ref 70–99)
Glucose-Capillary: 136 mg/dL — ABNORMAL HIGH (ref 70–99)
Glucose-Capillary: 163 mg/dL — ABNORMAL HIGH (ref 70–99)
Glucose-Capillary: 180 mg/dL — ABNORMAL HIGH (ref 70–99)
Glucose-Capillary: 188 mg/dL — ABNORMAL HIGH (ref 70–99)

## 2018-09-10 LAB — ABO/RH: ABO/RH(D): A POS

## 2018-09-10 LAB — PHOSPHORUS
Phosphorus: 1.9 mg/dL — ABNORMAL LOW (ref 2.5–4.6)
Phosphorus: 1.6 mg/dL — ABNORMAL LOW (ref 2.5–4.6)

## 2018-09-10 LAB — PREPARE RBC (CROSSMATCH)

## 2018-09-10 SURGERY — IR WITH ANESTHESIA
Anesthesia: General

## 2018-09-10 MED ORDER — SODIUM CHLORIDE 0.9% IV SOLUTION: Freq: Once | INTRAVENOUS | Status: DC

## 2018-09-10 MED ORDER — SODIUM CHLORIDE 0.9% FLUSH: 10.0000 mL | INTRAVENOUS | Status: DC | PRN

## 2018-09-10 MED ORDER — PROPOFOL 10 MG/ML IV BOLUS
INTRAVENOUS | Status: DC | PRN
  Administered 2018-09-10 (×2): 50 mg via INTRAVENOUS

## 2018-09-10 MED ORDER — SODIUM CHLORIDE 0.9% FLUSH
10.0000 mL | Freq: Two times a day (BID) | INTRAVENOUS | Status: DC
  Administered 2018-09-10: 10 mL
  Administered 2018-09-10: 20 mL
  Administered 2018-09-11: 22:00:00 10 mL
  Administered 2018-09-11: 20 mL
  Administered 2018-09-12: 10 mL

## 2018-09-10 MED ORDER — INSULIN DETEMIR 100 UNIT/ML ~~LOC~~ SOLN
10.0000 [IU] | Freq: Every day | SUBCUTANEOUS | Status: DC
  Administered 2018-09-10 – 2018-09-12 (×3): 10 [IU] via SUBCUTANEOUS
  Filled 2018-09-10 (×3): qty 0.1

## 2018-09-10 MED ORDER — SODIUM CHLORIDE 0.9 % IV SOLN
3.0000 g | Freq: Four times a day (QID) | INTRAVENOUS | Status: DC
  Administered 2018-09-10 – 2018-09-12 (×9): 3 g via INTRAVENOUS
  Filled 2018-09-10: qty 8
  Filled 2018-09-10: qty 3
  Filled 2018-09-10: qty 8
  Filled 2018-09-10 (×5): qty 3
  Filled 2018-09-10: qty 8
  Filled 2018-09-10 (×2): qty 3
  Filled 2018-09-10: qty 8
  Filled 2018-09-10: qty 3

## 2018-09-10 MED ORDER — CHLORHEXIDINE GLUCONATE CLOTH 2 % EX PADS
6.0000 | MEDICATED_PAD | Freq: Every day | CUTANEOUS | Status: DC
  Administered 2018-09-10: 6 via TOPICAL

## 2018-09-10 MED ORDER — ROCURONIUM BROMIDE 10 MG/ML (PF) SYRINGE: PREFILLED_SYRINGE | INTRAVENOUS | Status: DC | PRN

## 2018-09-10 MED ORDER — ROCURONIUM BROMIDE 10 MG/ML (PF) SYRINGE
PREFILLED_SYRINGE | INTRAVENOUS | Status: DC | PRN
  Administered 2018-09-10: 60 mg via INTRAVENOUS
  Administered 2018-09-10: 40 mg via INTRAVENOUS

## 2018-09-10 MED ORDER — IOHEXOL 300 MG/ML  SOLN
150.0000 mL | Freq: Once | INTRAMUSCULAR | Status: AC | PRN
  Administered 2018-09-10: 16:00:00 70 mL via INTRA_ARTERIAL

## 2018-09-10 NOTE — Progress Notes (Signed)
Peripherally Inserted Central Catheter/Midline Placement  The IV Nurse has discussed with the patient and/or persons authorized to consent for the patient, the purpose of this procedure and the potential benefits and risks involved with this procedure.  The benefits include less needle sticks, lab draws from the catheter, and the patient may be discharged home with the catheter. Risks include, but not limited to, infection, bleeding, blood clot (thrombus formation), and puncture of an artery; nerve damage and irregular heartbeat and possibility to perform a PICC exchange if needed/ordered by physician.  Alternatives to this procedure were also discussed.  Bard Power PICC patient education guide, fact sheet on infection prevention and patient information card has been provided to patient /or left at bedside.    PICC/Midline Placement Documentation  PICC Triple Lumen 20/35/59 PICC Right Basilic 35 cm 0 cm (Active)  Indication for Insertion or Continuance of Line Prolonged intravenous therapies 09/28/18 0848  Exposed Catheter (cm) 0 cm 09/28/18 0848  Site Assessment Clean;Dry;Intact 28-Sep-2018 0848  Lumen #1 Status Flushed;Blood return noted 09-28-18 0848  Lumen #2 Status Flushed;Blood return noted Sep 28, 2018 0848  Lumen #3 Status Flushed;Blood return noted 2018-09-28 0848  Dressing Type Transparent 09-28-18 0848  Dressing Status Clean;Dry;Intact;Antimicrobial disc in place 09/28/18 0848  Dressing Intervention New dressing 09-28-2018 0848  Dressing Change Due 09/17/18 09/28/18 0848    Consent signd by spouse   Synthia Innocent 09/28/2018, 8:49 AM

## 2018-09-10 NOTE — Progress Notes (Signed)
  NEUROSURGERY PROGRESS NOTE   Pt seen and examined. No issues overnight.   EXAM: Temp:  [98.2 F (36.8 C)-99.3 F (37.4 C)] 98.4 F (36.9 C) (09/09 0400) Pulse Rate:  [46-63] 49 (09/09 0740) Resp:  [28-35] 28 (09/09 0740) BP: (87-107)/(59-70) 100/66 (09/09 0600) SpO2:  [92 %-100 %] 97 % (09/09 0740) Arterial Line BP: (93-122)/(47-80) 121/55 (09/09 0500) FiO2 (%):  [40 %] 40 % (09/09 0740) Weight:  [72.8 kg] 72.8 kg (09/09 0422) Intake/Output      09/08 0701 - 09/09 0700 09/09 0701 - 09/10 0700   I.V. (mL/kg) 1394.1 (19.1)    NG/GT 100    IV Piggyback 150.4    Total Intake(mL/kg) 1644.5 (22.6)    Urine (mL/kg/hr) 300 (0.2)    Drains 47    Total Output 347    Net +1297.5         Urine Occurrence 1 x     Eyes open spontaneously Right pupil 6-18mm, non-reactive Left pupil 64mm, briskly reactive Intubated, breathing over vent Localizes LUE W/d RUE/RLE EVD in place and patent, draining bloody CSF  LABS: Lab Results  Component Value Date   CREATININE 0.75 09/30/18   BUN 30 (H) 30-Sep-2018   NA 142 09/30/2018   K 3.6 Sep 30, 2018   CL 113 (H) September 30, 2018   CO2 20 (L) Sep 30, 2018   Lab Results  Component Value Date   WBC 13.7 (H) 09/30/2018   HGB 11.0 (L) 09-30-2018   HCT 32.7 (L) 2018-09-30   MCV 102.2 (H) 09/30/2018   PLT 116 (L) 09-30-18    IMAGING: CTH reviewed 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 female SAH d# 3 and associated respiratory decompensation (?aspiration) now more stable   PLAN: - Will proceed with angiogram this pm, treatment of any identified aneurysm - Cont vent mgmt per PCCM.

## 2018-09-10 NOTE — Anesthesia Postprocedure Evaluation (Signed)
Anesthesia Post Note  Patient: Joann Johnson  Procedure(s) Performed: SUBARACHNOID (N/A )     Patient location during evaluation: ICU Anesthesia Type: General Level of consciousness: sedated and patient remains intubated per anesthesia plan Pain management: pain level controlled Vital Signs Assessment: post-procedure vital signs reviewed and stable Respiratory status: patient remains intubated per anesthesia plan Cardiovascular status: stable Postop Assessment: no apparent nausea or vomiting Anesthetic complications: no    Last Vitals:  Vitals:   09/08/2018 1300 09/15/2018 1652  BP: 112/68   Pulse: (!) 54   Resp: (!) 28   Temp:    SpO2: 95% 94%    Last Pain:  Vitals:   09/26/2018 1200  TempSrc: Axillary                 Audry Pili

## 2018-09-10 NOTE — Anesthesia Procedure Notes (Signed)
Arterial Line Insertion Start/End09/03/2018 2:39 AM, 09/23/2018 2:44 PM Performed by: Shirlyn Goltz, CRNA, CRNA  Patient location: OR. Preanesthetic checklist: patient identified, IV checked, site marked, risks and benefits discussed, surgical consent, monitors and equipment checked, pre-op evaluation and timeout performed Patient sedated Left, radial was placed Catheter size: 20 G Hand hygiene performed  and maximum sterile barriers used   Attempts: 1 Procedure performed without using ultrasound guided technique. Following insertion, dressing applied and Biopatch. Post procedure assessment: normal  Patient tolerated the procedure well with no immediate complications.

## 2018-09-10 NOTE — Progress Notes (Signed)
NAME:  Joann Johnson, MRN:  [id number], DOB:  [birth year], LOS: 3 ADMISSION DATE:  09/26/18, CONSULTATION DATE: 9/6 REFERRING MD:  Johnney Killian, CHIEF COMPLAINT:  Comatose   Brief History   58 yo female found unresponsive.  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  Required intubation for airway protection with aspiration pneumonia.  Past Medical History  HTN, [place] [facility] Events   9/6 Admitted to [place] for [facility], hydrocephalus s/p ventriculostomy 9/7 Worsening hypoxemia and hypotension  Consults:  Neurosurgery   Procedures:  Ventriculostomy 9/6 >> ETT 9/6 >> Rt radial A line 9/7 >>  Lt femoral CVL 9/7 >>   Significant Diagnostic Tests:  CT head 9/06 >> large amount b/l SAH extending to 4th ventricle, mild hydrocephalus  Micro Data:  SARS CoV2 9/06 >> negative Blood 9/07 >>   Antimicrobials:  Zosyn 9/6 >>  Vanc 9/6 >> 9/8  Interim history/subjective:  Remains on vasopressin was discontinued.  Systolic blood pressure greater than 140.  Objective   Blood pressure 100/66, pulse (!) 49, temperature 98.4 F (36.9 C), temperature source Axillary, resp. rate (!) 28, height 5\' 6"  (1.676 m), weight 72.8 kg, last menstrual period 01/02/2000, SpO2 97 %.    Vent Mode: PRVC FiO2 (%):  [40 %] 40 % Set Rate:  [28 bmp] 28 bmp Vt Set:  [410 mL] 410 mL PEEP:  [5 cmH20] 5 cmH20 Plateau Pressure:  [16 cmH20-17 cmH20] 17 cmH20   Intake/Output Summary (Last 24 hours) at 09/16/2018 0836 Last data filed at 09/17/2018 0700 Gross per 24 hour  Intake 1644.53 ml  Output 343 ml  Net 1301.53 ml   Filed Weights   26-Sep-2018 2237 09/09/18 0500 09/24/2018 0422  Weight: 75 kg 71.8 kg 72.8 kg   Physical Exam:  General: 58 year old female who appears critically ill HEENT: Interventricular drain is in place.  Cervical collar is in place.  Right eye is open.  Pupils are essentially equal reactive Neuro: Does not follow commands.  Has left-sided movement spontaneously CV: Heart  sounds regular regular rate rhythm PULM: Mild rhonchi GI: soft, bsx4 active  Extremities: warm/dry, negative edema  Skin: no rashes or lesions    Resolved Hospital Problem list   Lactic acidosis, AKI  Assessment & Plan:   Acute hypoxic respiratory failure from aspiration pneumonia. Continue full mechanical dilatory support Pain O2 saturations greater than 92% Serial chest x-rays and arterial blood gases  Sepsis from aspiration pneumonia. Blood culture from 9 7 shows no growth for 2 days.  No sputum culture has been obtained. Day 4 of Zosyn and vancomycin was discontinued on 09/09/2018 Pressor support as needed currently only on vasopressin will discontinue.  Pressors are needed we will resume Levophed Bedside echo to identify any ventricular dysfunction  SAH s/p ventriculostomy. Plan as per neurosurgery during angiogram questionable return to the OR Continue steroids and antihypertensives  Acute metabolic encephalopathy. RA SS goal -1 Remains on propofol drip Monitor triglycerides  Steroid induced hyperglycemia. CBG (last 3)  Recent Labs    09/09/18 1959 09/09/18 2338 09/23/2018 0347  GLUCAP 174* 167* 188*    Continue sliding scale insulin Add low-dose Levemir while on Decadron 09/20/202012  Best practice:  Diet: tube feeds DVT prophylaxis: SCDs GI prophylaxis: protonix Mobility: bedrest Code Status: DNR Disposition: ICU Family updated via electronic link  Labs    CMP Latest Ref Rng & Units 09/22/2018 09/09/2018 09/09/2018  Glucose 70 - 99 mg/dL 190(H) - -  BUN 6 - 20 mg/dL 30(H) - -  Creatinine 0.44 - 1.00 mg/dL 4.090.75 - -  Sodium 811135 - 145 mmol/L 142 144 142  Potassium 3.5 - 5.1 mmol/L 3.6 3.4(L) 3.5  Chloride 98 - 111 mmol/L 113(H) - -  CO2 22 - 32 mmol/L 20(L) - -  Calcium 8.9 - 10.3 mg/dL 7.5(L) - -  Total Protein 6.5 - 8.1 g/dL - - -  Total Bilirubin 0.3 - 1.2 mg/dL - - -  Alkaline Phos 38 - 126 U/L - - -  AST 15 - 41 U/L - - -  ALT 0 - 44 U/L - - -   CBC  Latest Ref Rng & Units 09/19/2018 09/09/2018 09/09/2018  WBC 4.0 - 10.5 K/uL 13.7(H) - 17.0(H)  Hemoglobin 12.0 - 15.0 g/dL 11.0(L) 10.9(L) 12.9  Hematocrit 36.0 - 46.0 % 32.7(L) 32.0(L) 37.6  Platelets 150 - 400 K/uL 116(L) - 172   ABG    Component Value Date/Time   PHART 7.408 09/09/2018 1033   PCO2ART 28.1 (L) 09/09/2018 1033   PO2ART 79.0 (L) 09/09/2018 1033   HCO3 17.7 (L) 09/09/2018 1033   TCO2 18 (L) 09/09/2018 1033   ACIDBASEDEF 6.0 (H) 09/09/2018 1033   O2SAT 96.0 09/09/2018 1033   CBG (last 3)  Recent Labs    09/09/18 1959 09/09/18 2338 09/28/2018 0347  GLUCAP 174* 167* 188*    App cct 45 min   Brett CanalesSteve Dannis Deroche ACNP [facility] Pager [phone] till 1 pm If no answer page [phone] 09/29/2018, 8:37 AM

## 2018-09-10 NOTE — Progress Notes (Signed)
Pt's EVD drain no longer draining and not pulsating in the tubing.  Last output recorded was at 1000 this am.  RN called Lyndle Herrlich, PA to make him aware.  Pt going for angiogram anytime with Dr. Kathyrn Sheriff.  RN waiting on response from [facility] to see how to proceed. Pt neurologically the same.  Husband at pt's bedside.  Consent for procedure signed and placed in chart.

## 2018-09-10 NOTE — Transfer of Care (Signed)
Immediate Anesthesia Transfer of Care Note  Patient: Joann Johnson  Procedure(s) Performed: SUBARACHNOID (N/A )  Patient Location: ICU  Anesthesia Type:General  Level of Consciousness: sedated and Patient remains intubated per anesthesia plan  Airway & Oxygen Therapy: Patient remains intubated per anesthesia plan and Patient placed on Ventilator (see vital sign flow sheet for setting)  Post-op Assessment: Report given to RN and Post -op Vital signs reviewed and stable  Post vital signs: Reviewed and stable  Last Vitals:  Vitals Value Taken Time  BP 132/69 09/02/2018 1649  Temp    Pulse 79 09/14/2018 1651  Resp 28 09/06/2018 1651  SpO2 94 % 09/13/2018 1651  Vitals shown include unvalidated device data.  Last Pain:  Vitals:   09/20/2018 1200  TempSrc: Axillary         Complications: No apparent anesthesia complications

## 2018-09-10 NOTE — Sedation Documentation (Signed)
Pt under the care of anesthesia  

## 2018-09-10 NOTE — Progress Notes (Signed)
Patient returned from OR. No noted respiratory issues at this time.

## 2018-09-10 NOTE — Anesthesia Preprocedure Evaluation (Signed)
Anesthesia Evaluation  Patient identified by MRN, date of birth, ID band Patient unresponsive    Reviewed: Allergy & Precautions, NPO status , Patient's Chart, lab work & pertinent test results  Airway Mallampati: Intubated  TM Distance: >3 FB Neck ROM: Full    Dental no notable dental hx.    Pulmonary  ARDS   Pulmonary exam normal breath sounds clear to auscultation       Cardiovascular hypertension, Normal cardiovascular exam Rhythm:Regular Rate:Normal     Neuro/Psych negative neurological ROS  negative psych ROS   GI/Hepatic negative GI ROS, Neg liver ROS,   Endo/Other  negative endocrine ROS  Renal/GU negative Renal ROS  negative genitourinary   Musculoskeletal negative musculoskeletal ROS (+)   Abdominal   Peds negative pediatric ROS (+)  Hematology negative hematology ROS (+)   Anesthesia Other Findings   Reproductive/Obstetrics negative OB ROS                             Anesthesia Physical Anesthesia Plan  ASA: IV  Anesthesia Plan: General   Post-op Pain Management:    Induction: Inhalational  PONV Risk Score and Plan:   Airway Management Planned: Oral ETT  Additional Equipment: Arterial line  Intra-op Plan:   Post-operative Plan: Post-operative intubation/ventilation  Informed Consent: I have reviewed the patients History and Physical, chart, labs and discussed the procedure including the risks, benefits and alternatives for the proposed anesthesia with the patient or authorized representative who has indicated his/her understanding and acceptance.     Dental advisory given  Plan Discussed with: CRNA and Surgeon  Anesthesia Plan Comments:         Anesthesia Quick Evaluation

## 2018-09-10 NOTE — Progress Notes (Signed)
I have reviewed the situation with the patient's husband including imaging findings thus far. We discussed the likelihood of an aneurysm and the need for angiogram to confirm. Treatment options for any aneurysm was discussed including endovascular coiling and surgical clip ligation. Risks of each procedure were reviewed including risk of stroke, hemorrhage, SZ, HCP, arterial dissection, contrast nephropathy, etc. Pts husband appeared to understand our discussion. All his questions were answered and he provided consent to proceed with angiogram and treatment of any identified aneurysms.

## 2018-09-10 NOTE — Brief Op Note (Signed)
  NEUROSURGERY BRIEF OPERATIVE  NOTE   PREOP DX: Subarachnoid Hemorrhage  POSTOP DX: Same  PROCEDURE: Diagnostic cerebral angiogram, coil embolization basilar apex aneurysm  SURGEON: Dr. Consuella Lose, MD  ANESTHESIA: GETA  EBL: Minimal  SPECIMENS: None  COMPLICATIONS: None  CONDITION: Hemodynamically stable to ICU  FINDINGS (Full report in [facility]): 1. ~12x7x6mm basilar apex successfully coiled without residual aneurysm post-embolization 2. No other aneurysms, AVM, fistulas identified 3. DynaCT demonstrates stable appearance of SAH, right frontal EVD remains within the right lateral ventricle.

## 2018-09-10 NOTE — Progress Notes (Signed)
The patient's nurse Katie informed me that her ventriculostomy is not patent.  Dr. Kathyrn Sheriff irrigated the ventriculostomy, but it is still not patent.  The patient's head CT after her aneurysmal coiling demonstrates her ventricles are smaller than they were at admission.  We will plan observation for now and plan to repeat her CAT scan in the morning to see if she needs to have another ventriculostomy placed.

## 2018-09-11 ENCOUNTER — Inpatient Hospital Stay (HOSPITAL_COMMUNITY): Payer: BC Managed Care – PPO

## 2018-09-11 ENCOUNTER — Encounter (HOSPITAL_COMMUNITY): Payer: Self-pay | Admitting: Neurosurgery

## 2018-09-11 DIAGNOSIS — I639 Cerebral infarction, unspecified: Secondary | ICD-10-CM

## 2018-09-11 LAB — CBC WITH DIFFERENTIAL/PLATELET
Abs Immature Granulocytes: 0.73 10*3/uL — ABNORMAL HIGH (ref 0.00–0.07)
Abs Immature Granulocytes: 0.62 10*3/uL — ABNORMAL HIGH (ref 0.00–0.07)
Basophils Absolute: 0.1 10*3/uL (ref 0.0–0.1)
Basophils Absolute: 0.1 10*3/uL (ref 0.0–0.1)
Basophils Relative: 0 %
Basophils Relative: 1 %
Eosinophils Absolute: 0 10*3/uL (ref 0.0–0.5)
Eosinophils Absolute: 0 10*3/uL (ref 0.0–0.5)
Eosinophils Relative: 0 %
Eosinophils Relative: 0 %
HCT: 31.2 % — ABNORMAL LOW (ref 36.0–46.0)
HCT: 34.1 % — ABNORMAL LOW (ref 36.0–46.0)
Hemoglobin: 10.6 g/dL — ABNORMAL LOW (ref 12.0–15.0)
Hemoglobin: 11.3 g/dL — ABNORMAL LOW (ref 12.0–15.0)
Immature Granulocytes: 5 %
Immature Granulocytes: 4 %
Lymphocytes Relative: 9 %
Lymphocytes Relative: 7 %
Lymphs Abs: 1.4 10*3/uL (ref 0.7–4.0)
Lymphs Abs: 1.1 10*3/uL (ref 0.7–4.0)
MCH: 33.9 pg (ref 26.0–34.0)
MCH: 33.4 pg (ref 26.0–34.0)
MCHC: 34 g/dL (ref 30.0–36.0)
MCHC: 33.1 g/dL (ref 30.0–36.0)
MCV: 99.7 fL (ref 80.0–100.0)
MCV: 100.9 fL — ABNORMAL HIGH (ref 80.0–100.0)
Monocytes Absolute: 0.7 10*3/uL (ref 0.1–1.0)
Monocytes Absolute: 1.3 10*3/uL — ABNORMAL HIGH (ref 0.1–1.0)
Monocytes Relative: 5 %
Monocytes Relative: 7 %
Neutro Abs: 12 10*3/uL — ABNORMAL HIGH (ref 1.7–7.7)
Neutro Abs: 13.8 10*3/uL — ABNORMAL HIGH (ref 1.7–7.7)
Neutrophils Relative %: 81 %
Neutrophils Relative %: 81 %
Platelets: 120 10*3/uL — ABNORMAL LOW (ref 150–400)
Platelets: 118 10*3/uL — ABNORMAL LOW (ref 150–400)
RBC: 3.13 MIL/uL — ABNORMAL LOW (ref 3.87–5.11)
RBC: 3.38 MIL/uL — ABNORMAL LOW (ref 3.87–5.11)
RDW: 13.2 % (ref 11.5–15.5)
RDW: 13.6 % (ref 11.5–15.5)
WBC: 14.8 10*3/uL — ABNORMAL HIGH (ref 4.0–10.5)
WBC: 16.9 10*3/uL — ABNORMAL HIGH (ref 4.0–10.5)
nRBC: 3.8 % — ABNORMAL HIGH (ref 0.0–0.2)
nRBC: 6.6 % — ABNORMAL HIGH (ref 0.0–0.2)

## 2018-09-11 LAB — POCT I-STAT 7, (LYTES, BLD GAS, ICA,H+H)
Acid-Base Excess: 2 mmol/L (ref 0.0–2.0)
Acid-base deficit: 1 mmol/L (ref 0.0–2.0)
Acid-base deficit: 1 mmol/L (ref 0.0–2.0)
Acid-base deficit: 2 mmol/L (ref 0.0–2.0)
Bicarbonate: 19.2 mmol/L — ABNORMAL LOW (ref 20.0–28.0)
Bicarbonate: 19.7 mmol/L — ABNORMAL LOW (ref 20.0–28.0)
Bicarbonate: 21.5 mmol/L (ref 20.0–28.0)
Bicarbonate: 24.1 mmol/L (ref 20.0–28.0)
Calcium, Ion: 1.06 mmol/L — ABNORMAL LOW (ref 1.15–1.40)
Calcium, Ion: 1.09 mmol/L — ABNORMAL LOW (ref 1.15–1.40)
Calcium, Ion: 1.09 mmol/L — ABNORMAL LOW (ref 1.15–1.40)
Calcium, Ion: 1.09 mmol/L — ABNORMAL LOW (ref 1.15–1.40)
HCT: 28 % — ABNORMAL LOW (ref 36.0–46.0)
HCT: 31 % — ABNORMAL LOW (ref 36.0–46.0)
HCT: 31 % — ABNORMAL LOW (ref 36.0–46.0)
HCT: 32 % — ABNORMAL LOW (ref 36.0–46.0)
Hemoglobin: 10.5 g/dL — ABNORMAL LOW (ref 12.0–15.0)
Hemoglobin: 10.5 g/dL — ABNORMAL LOW (ref 12.0–15.0)
Hemoglobin: 10.9 g/dL — ABNORMAL LOW (ref 12.0–15.0)
Hemoglobin: 9.5 g/dL — ABNORMAL LOW (ref 12.0–15.0)
O2 Saturation: 100 %
O2 Saturation: 94 %
O2 Saturation: 95 %
O2 Saturation: 97 %
Patient temperature: 97.6
Patient temperature: 97.6
Patient temperature: 97.6
Patient temperature: 98.6
Potassium: 3.2 mmol/L — ABNORMAL LOW (ref 3.5–5.1)
Potassium: 3.3 mmol/L — ABNORMAL LOW (ref 3.5–5.1)
Potassium: 3.4 mmol/L — ABNORMAL LOW (ref 3.5–5.1)
Potassium: 3.6 mmol/L (ref 3.5–5.1)
Sodium: 154 mmol/L — ABNORMAL HIGH (ref 135–145)
Sodium: 154 mmol/L — ABNORMAL HIGH (ref 135–145)
Sodium: 156 mmol/L — ABNORMAL HIGH (ref 135–145)
Sodium: 157 mmol/L — ABNORMAL HIGH (ref 135–145)
TCO2: 20 mmol/L — ABNORMAL LOW (ref 22–32)
TCO2: 20 mmol/L — ABNORMAL LOW (ref 22–32)
TCO2: 22 mmol/L (ref 22–32)
TCO2: 25 mmol/L (ref 22–32)
pCO2 arterial: 21.1 mmHg — ABNORMAL LOW (ref 32.0–48.0)
pCO2 arterial: 21.9 mmHg — ABNORMAL LOW (ref 32.0–48.0)
pCO2 arterial: 26.3 mmHg — ABNORMAL LOW (ref 32.0–48.0)
pCO2 arterial: 29.4 mmHg — ABNORMAL LOW (ref 32.0–48.0)
pH, Arterial: 7.519 — ABNORMAL HIGH (ref 7.350–7.450)
pH, Arterial: 7.521 — ABNORMAL HIGH (ref 7.350–7.450)
pH, Arterial: 7.559 — ABNORMAL HIGH (ref 7.350–7.450)
pH, Arterial: 7.566 — ABNORMAL HIGH (ref 7.350–7.450)
pO2, Arterial: 346 mmHg — ABNORMAL HIGH (ref 83.0–108.0)
pO2, Arterial: 59 mmHg — ABNORMAL LOW (ref 83.0–108.0)
pO2, Arterial: 61 mmHg — ABNORMAL LOW (ref 83.0–108.0)
pO2, Arterial: 71 mmHg — ABNORMAL LOW (ref 83.0–108.0)

## 2018-09-11 LAB — BASIC METABOLIC PANEL
Anion gap: 9 (ref 5–15)
BUN: 19 mg/dL (ref 6–20)
CO2: 20 mmol/L — ABNORMAL LOW (ref 22–32)
Calcium: 7.5 mg/dL — ABNORMAL LOW (ref 8.9–10.3)
Chloride: 123 mmol/L — ABNORMAL HIGH (ref 98–111)
Creatinine, Ser: 0.69 mg/dL (ref 0.44–1.00)
GFR calc Af Amer: >60 mL/min (ref >60)
GFR calc non Af Amer: >60 mL/min (ref >60)
Glucose, Bld: 138 mg/dL — ABNORMAL HIGH (ref 70–99)
Potassium: 2.6 mmol/L — CL (ref 3.5–5.1)
Sodium: 152 mmol/L — ABNORMAL HIGH (ref 135–145)

## 2018-09-11 LAB — PROTIME-INR
INR: 1.3 — ABNORMAL HIGH (ref 0.8–1.2)
Prothrombin Time: 15.6 seconds — ABNORMAL HIGH (ref 11.4–15.2)

## 2018-09-11 LAB — COMPREHENSIVE METABOLIC PANEL
ALT: 123 U/L — ABNORMAL HIGH (ref 0–44)
AST: 76 U/L — ABNORMAL HIGH (ref 15–41)
Albumin: 2.3 g/dL — ABNORMAL LOW (ref 3.5–5.0)
Alkaline Phosphatase: 46 U/L (ref 38–126)
Anion gap: 7 (ref 5–15)
BUN: 20 mg/dL (ref 6–20)
CO2: 21 mmol/L — ABNORMAL LOW (ref 22–32)
Calcium: 7.3 mg/dL — ABNORMAL LOW (ref 8.9–10.3)
Chloride: 128 mmol/L — ABNORMAL HIGH (ref 98–111)
Creatinine, Ser: 0.53 mg/dL (ref 0.44–1.00)
GFR calc Af Amer: >60 mL/min (ref >60)
GFR calc non Af Amer: >60 mL/min (ref >60)
Glucose, Bld: 142 mg/dL — ABNORMAL HIGH (ref 70–99)
Potassium: 3.3 mmol/L — ABNORMAL LOW (ref 3.5–5.1)
Sodium: 156 mmol/L — ABNORMAL HIGH (ref 135–145)
Total Bilirubin: 1 mg/dL (ref 0.3–1.2)
Total Protein: 5.1 g/dL — ABNORMAL LOW (ref 6.5–8.1)

## 2018-09-11 LAB — GLUCOSE, CAPILLARY
Glucose-Capillary: 119 mg/dL — ABNORMAL HIGH (ref 70–99)
Glucose-Capillary: 148 mg/dL — ABNORMAL HIGH (ref 70–99)
Glucose-Capillary: 138 mg/dL — ABNORMAL HIGH (ref 70–99)

## 2018-09-11 LAB — TRIGLYCERIDES: Triglycerides: 450 mg/dL — ABNORMAL HIGH (ref <150)

## 2018-09-11 LAB — BILIRUBIN, DIRECT: Bilirubin, Direct: 0.4 mg/dL — ABNORMAL HIGH (ref 0.0–0.2)

## 2018-09-11 LAB — MAGNESIUM
Magnesium: 2.7 mg/dL — ABNORMAL HIGH (ref 1.7–2.4)
Magnesium: 2.7 mg/dL — ABNORMAL HIGH (ref 1.7–2.4)

## 2018-09-11 LAB — PHOSPHORUS
Phosphorus: 1.2 mg/dL — ABNORMAL LOW (ref 2.5–4.6)
Phosphorus: 2 mg/dL — ABNORMAL LOW (ref 2.5–4.6)

## 2018-09-11 LAB — POCT ACTIVATED CLOTTING TIME: Activated Clotting Time: 0 seconds

## 2018-09-11 LAB — LACTATE DEHYDROGENASE: LDH: 381 U/L — ABNORMAL HIGH (ref 98–192)

## 2018-09-11 LAB — GAMMA GT: GGT: 176 U/L — ABNORMAL HIGH (ref 7–50)

## 2018-09-11 LAB — APTT: aPTT: 24 seconds (ref 24–36)

## 2018-09-11 MED ORDER — NICARDIPINE HCL IN NACL 20-0.86 MG/200ML-% IV SOLN
INTRAVENOUS | Status: AC
  Filled 2018-09-11: qty 200

## 2018-09-11 MED ORDER — SODIUM CHLORIDE 0.9 % IV SOLN
INTRAVENOUS | Status: DC | PRN
  Administered 2018-09-11: 06:00:00 250 mL via INTRAVENOUS

## 2018-09-11 MED ORDER — LACTATED RINGERS IV SOLN
INTRAVENOUS | Status: DC
  Administered 2018-09-11 – 2018-09-12 (×2): via INTRAVENOUS

## 2018-09-11 MED ORDER — SODIUM CHLORIDE 0.9 % IV SOLN: 20.0000 ug | Freq: Once | INTRAVENOUS | Status: DC | PRN

## 2018-09-11 MED ORDER — ATROPINE SULFATE 1 MG/ML IJ SOLN
INTRAMUSCULAR | Status: AC
  Filled 2018-09-11: qty 1

## 2018-09-11 MED ORDER — SODIUM CHLORIDE 0.9 % IV SOLN
2.0000 ug | Freq: Once | INTRAVENOUS | Status: AC | PRN
  Administered 2018-09-11: 2 ug via INTRAVENOUS
  Filled 2018-09-11 (×2): qty 0.5

## 2018-09-11 MED ORDER — SODIUM CHLORIDE 0.9 % IV SOLN
2.0000 ug | Freq: Once | INTRAVENOUS | Status: DC
  Filled 2018-09-11: qty 0.5

## 2018-09-11 MED ORDER — NICARDIPINE HCL IN NACL 20-0.86 MG/200ML-% IV SOLN
3.0000 mg/h | INTRAVENOUS | Status: DC
  Administered 2018-09-11: 3 mg/h via INTRAVENOUS
  Administered 2018-09-12: 4 mg/h via INTRAVENOUS
  Administered 2018-09-12 (×2): 3 mg/h via INTRAVENOUS
  Administered 2018-09-12: 7.5 mg/h via INTRAVENOUS
  Filled 2018-09-11 (×4): qty 200

## 2018-09-11 MED ORDER — IOHEXOL 350 MG/ML SOLN
75.0000 mL | Freq: Once | INTRAVENOUS | Status: AC | PRN
  Administered 2018-09-11: 75 mL via INTRAVENOUS

## 2018-09-11 MED ORDER — POTASSIUM CHLORIDE 10 MEQ/50ML IV SOLN
10.0000 meq | INTRAVENOUS | Status: DC
  Administered 2018-09-11 (×3): 10 meq via INTRAVENOUS
  Filled 2018-09-11 (×4): qty 50

## 2018-09-11 MED ORDER — PHENYLEPHRINE HCL-NACL 10-0.9 MG/250ML-% IV SOLN
0.0000 ug/min | INTRAVENOUS | Status: DC
  Administered 2018-09-11: 09:00:00 20 ug/min via INTRAVENOUS
  Filled 2018-09-11: qty 250

## 2018-09-11 MED ORDER — SODIUM CHLORIDE 23.4 % INJECTION (4 MEQ/ML) FOR IV ADMINISTRATION
120.0000 meq | Freq: Once | INTRAVENOUS | Status: AC
  Administered 2018-09-11: 120 meq via INTRAVENOUS
  Filled 2018-09-11: qty 30

## 2018-09-11 MED ORDER — POTASSIUM CHLORIDE 20 MEQ/15ML (10%) PO SOLN
40.0000 meq | Freq: Two times a day (BID) | ORAL | Status: DC
  Administered 2018-09-11 – 2018-09-12 (×3): 40 meq
  Filled 2018-09-11 (×3): qty 30

## 2018-09-11 NOTE — Progress Notes (Signed)
Neurosurgery notified of pupil change for pt. RN will continue to monitor.

## 2018-09-11 NOTE — Progress Notes (Signed)
PT transported to CT and back with no complications 

## 2018-09-11 NOTE — Progress Notes (Signed)
[facility] Progress Note Patient Name: Joann Johnson DOB: 08-24-1960 MRN: [id number]   Date of Service  09/11/2018  HPI/Events of Note  PH 7.52, PO2 61  eICU Interventions  RR reduced to 18, PEEP increased to 10, ABG at 2 AM.        [name] 09/11/2018, 11:55 PM

## 2018-09-11 NOTE — Progress Notes (Signed)
CDS called. Ref # [id number] GCS 3

## 2018-09-11 NOTE — Progress Notes (Signed)
[facility] Progress Note Patient Name: Joann Johnson DOB: [birth year] MRN: [id number]   Date of Service  09/11/2018  HPI/Events of Note  K+ 2.6  eICU Interventions  Elink electrolyte replacement protocol for K+ of 2.6 ordered.        Kerry Kass Ogan 09/11/2018, 5:29 AM

## 2018-09-11 NOTE — Progress Notes (Signed)
TCD test  has been completed. [facility] under chart review to view preliminary results.   09/11/2018  12:49 PM Quantavis Obryant, Bonnye Fava

## 2018-09-11 NOTE — Progress Notes (Signed)
Dr Kathyrn Sheriff and Dr. Lynetta Mare spoke with pt's family about pt's poor prognosis.  Pt's husband is attempting to cope with this and speaking about organ donation.  Pt first person consent.  Pt has not progressed to brain death d/t still having corneal and cough.    Pt's husband decided to proceed with organ donation.  CDS team on site.

## 2018-09-11 NOTE — Progress Notes (Signed)
  NEUROSURGERY PROGRESS NOTE   Pt seen and examined. Events of this am reviewed, was found to have large unresponsive right pupil this am, after sedation was discontinued she had no motor responses.   EXAM: Temp:  [97.5 F (36.4 C)-97.8 F (36.6 C)] 97.6 F (36.4 C) (09/10 0800) Pulse Rate:  [38-71] 41 (09/10 1200) Resp:  [22-34] 27 (09/10 1200) BP: (105-156)/(61-78) 149/78 (09/10 1200) SpO2:  [92 %-100 %] 98 % (09/10 1200) Arterial Line BP: (89-192)/(57-79) 192/77 (09/10 1200) FiO2 (%):  [40 %] 40 % (09/10 1120) Weight:  [72.8 kg] 72.8 kg (09/10 0500) Intake/Output      09/09 0701 - 09/10 0700 09/10 0701 - 09/11 0700   I.V. (mL/kg) 1620.1 (22.3) 78.1 (1.1)   NG/GT     IV Piggyback 925.9    Total Intake(mL/kg) 2545.9 (35) 78.1 (1.1)   Urine (mL/kg/hr) 2875 (1.6)    Drains 13    Blood 15    Total Output 2903    Net -357.1 +78.1         No eye opening to pain Both pupils 31mm, non-reactive Not breathing over vent (-) cough/gag No motor responses to pain  LABS: Lab Results  Component Value Date   CREATININE 0.69 09/11/2018   BUN 19 09/11/2018   NA 152 (H) 09/11/2018   K 2.6 (LL) 09/11/2018   CL 123 (H) 09/11/2018   CO2 20 (L) 09/11/2018   Lab Results  Component Value Date   WBC 14.8 (H) 09/11/2018   HGB 10.6 (L) 09/11/2018   HCT 31.2 (L) 09/11/2018   MCV 99.7 09/11/2018   PLT 120 (L) 09/11/2018    IMAGING: CT without HCP or new hemorrhage but suggestive of cerebellar/occipital infarct. CTA confirmed complete occlusion of the basilar artery and the intracranial portions of the vertebral arteries.  IMPRESSION: - 58 y.o. 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 I told him she has essentially no brainstem function and that  recovery is essentially impossible. Discussed options including one-way extubation. He is calling close family members now.

## 2018-09-11 NOTE — Progress Notes (Signed)
[facility] Progress Note Patient Name: Joann Johnson DOB: 1960/04/21 MRN: [id number]   Date of Service  09/11/2018  HPI/Events of Note  Ph 7.55 on ABG. Patient was breathing 28 times a minute although set rate was 22 bpm at the time of the gas. With sedation she is now breathing 22 bpm.  eICU Interventions  Recheck ABG at midnight, no changes for now.        Kerry Kass Ogan 09/11/2018, 9:56 PM

## 2018-09-11 NOTE — Progress Notes (Signed)
NAME:  Joann ArgyleLisa D Darwin, MRN:  [id number], DOB:  1960-11-02, LOS: 4 ADMISSION DATE:  09/30/2018, CONSULTATION DATE: 9/6 REFERRING MD:  Donnald GarrePfeiffer, CHIEF COMPLAINT:  Comatose   Brief History   58 yo female found unresponsive.  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  Required intubation for airway protection with aspiration pneumonia.  Past Medical History  HTN, HLD   Significant Hospital Events   [facility]/p [facility]ul[facility] 9/7 Worsening hypoxemia and hypotension 9/9 Coil embolization of basilar tip aneurysm. Consults:  Neurosurgery   Procedures:  Ventriculostomy 9/6 >> ETT 9/6 >> Rt radial A line 9/7 >>  Lt femoral CVL 9/7 >>   Significant Diagnostic Tests:  CT head 9/06 >> large amount b/l SAH extending to 4th ventricle, mild hydrocephalus CT head 9/10>> Decreasing amount of subarachnoid blood status post coiling.  Ventricular system decompressed.  Possible suboccipital infarction Micro Data:  SARS CoV2 9/06 >> negative Blood 9/07 >>   Antimicrobials:  Zosyn 9/6 >>  Vanc 9/6 >> 9/8  Interim history/subjective:  Dilated right pupil this morning. No longer reacting to pain.  Now having episodes of bradycardia.  Sedation off.  Objective   Blood pressure 123/71, pulse (!) 45, temperature (!) 97.5 F (36.4 C), temperature source Axillary, resp. rate (!) 28, height 5\' 6"  (1.676 m), weight 72.8 kg, last menstrual period 01/02/2000, SpO2 100 %.    Vent Mode: PRVC FiO2 (%):  [40 %] 40 % Set Rate:  [28 bmp] 28 bmp Vt Set:  [410 mL-470 mL] 470 mL PEEP:  [5 cmH20] 5 cmH20 Plateau Pressure:  [17 cmH20-18 cmH20] 17 cmH20   Intake/Output Summary (Last 24 hours) at 09/11/2018 0806 Last data filed at 09/11/2018 0300 Gross per 24 hour  Intake 1537.08 ml  Output 2899 ml  Net -1361.92 ml   Filed Weights   09/09/18 0500 09/16/2018 0422 09/11/18 0500  Weight: 71.8 kg 72.8 kg 72.8 kg   Physical Exam:  General: 58 year old female who  appears critically ill HEENT: Interventricular drain is in place.  Cervical collar is in place.  Pupils are both dilated and unreactive.  Right pupil larger than left. Neuro: Does not follow commands, no response to painful stimuli. CV: Heart sounds regular regular rate rhythm PULM: Chest is clear with no ventilator asynchrony GI: soft, bsx4 active  Extremities: warm/dry, negative edema  Skin: no rashes or lesions  Resolved Hospital Problem list   Lactic acidosis, AKI  Assessment & Plan:   Critically ill due to acute hypoxic respiratory requiring mechanical ventilation Continue full ventilatory support  Aspiration pneumonia. Complete 5-day course of Unasyn.  [facility] of pending cerebral herniation or brainstem infarct.  Requiring hemodynamic augmentation and administration of hyperosmolar therapy.  s/p ventriculostomy, status post coiling.  Now post ictus day 4 Showing signs of neurological decline At risk for vasospasm -Daily TCD Initiate augmentation with phenylephrine to keep systolic blood pressure greater than 180 Preemptively treat cerebral edema with hypertonic saline 23% Repeat CT imaging.  Hypokalemia Oral replacement ordered  Best practice:  Diet: tube feeds DVT prophylaxis: SCDs GI prophylaxis: protonix Mobility: bedrest Code Status: DNR Disposition: ICU Family: will update following CT.   Labs    CMP Latest Ref Rng & Units 09/11/2018 09/22/2018 09/09/2018  Glucose 70 - 99 mg/dL 409(W138(H) 119(J190(H) -  BUN 6 - 20 mg/dL 19 47(W30(H) -  Creatinine 0.44 - 1.00 mg/dL 2.950.69 6.210.75 -  Sodium 308135 - 145 mmol/L 152(H) 142  144  Potassium 3.5 - 5.1 mmol/L 2.6(LL) 3.6 3.4(L)  Chloride 98 - 111 mmol/L 123(H) 113(H) -  CO2 22 - 32 mmol/L 20(L) 20(L) -  Calcium 8.9 - 10.3 mg/dL 7.5(L) 7.5(L) -  Total Protein 6.5 - 8.1 g/dL - - -  Total Bilirubin 0.3 - 1.2 mg/dL - - -  Alkaline Phos 38 - 126 U/L - - -  AST 15 - 41 U/L - - -  ALT 0 - 44 U/L - - -   CBC  Latest Ref Rng & Units 09/11/2018 09/18/2018 09/09/2018  WBC 4.0 - 10.5 K/uL 14.8(H) 13.7(H) -  Hemoglobin 12.0 - 15.0 g/dL 10.6(L) 11.0(L) 10.9(L)  Hematocrit 36.0 - 46.0 % 31.2(L) 32.7(L) 32.0(L)  Platelets 150 - 400 K/uL 120(L) 116(L) -   ABG    Component Value Date/Time   PHART 7.408 09/09/2018 1033   PCO2ART 28.1 (L) 09/09/2018 1033   PO2ART 79.0 (L) 09/09/2018 1033   HCO3 17.7 (L) 09/09/2018 1033   TCO2 18 (L) 09/09/2018 1033   ACIDBASEDEF 6.0 (H) 09/09/2018 1033   O2SAT 96.0 09/09/2018 1033   CBG (last 3)  Recent Labs    09/14/2018 1943 09/30/2018 2333 09/11/18 0351  GLUCAP 136* 130* 119*    CRITICAL CARE Performed by: Kipp Brood   Total critical care time: 45 minutes  Critical care time was exclusive of separately billable procedures and treating other patients.  Critical care was necessary to treat or prevent imminent or life-threatening deterioration.  Critical care was time spent personally by me on the following activities: development of treatment plan with patient and/or surrogate as well as nursing, discussions with consultants, evaluation of patient's response to treatment, examination of patient, obtaining history from patient or surrogate, ordering and performing treatments and interventions, ordering and review of laboratory studies, ordering and review of radiographic studies, pulse oximetry, re-evaluation of patient's condition and participation in multidisciplinary rounds.  Kipp Brood, MD [facility] ICU Physician [place]  Pager: [phone] Mobile: [phone] After hours: [phone].   09/11/2018, 8:06 AM

## 2018-09-11 NOTE — Progress Notes (Signed)
Patient ID: Joann Johnson, female   DOB: 1960-05-06, 58 y.o.   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  This morning she dilated her right pupil repeat CT scan overall looks about the same she does not have hydrocephalus no new hemorrhage  I attempted to flush her drain I backed it out 1 cm to try to get the tip back in the lateral ventricle it would flush but it does not drain.  I do not think that hydrocephalus is a reason for her mental status changes as her ventricles are still small compared to preop in addition replacement may somewhat be technically challenging due to small ventricular anatomy.  We will order a CT angiogram and rule out vascular event or brainstem.

## 2018-09-12 ENCOUNTER — Inpatient Hospital Stay (HOSPITAL_COMMUNITY): Payer: BC Managed Care – PPO

## 2018-09-12 ENCOUNTER — Encounter (HOSPITAL_COMMUNITY): Payer: Self-pay | Admitting: Certified Registered"

## 2018-09-12 ENCOUNTER — Encounter (HOSPITAL_COMMUNITY): Admission: EM | Disposition: E | Payer: Self-pay | Source: Home / Self Care | Attending: Neurosurgery

## 2018-09-12 HISTORY — PX: ORGAN PROCUREMENT: SHX5270

## 2018-09-12 LAB — URINALYSIS, ROUTINE W REFLEX MICROSCOPIC
Bacteria, UA: NONE SEEN
Bilirubin Urine: NEGATIVE
Glucose, UA: NEGATIVE mg/dL
Ketones, ur: NEGATIVE mg/dL
Leukocytes,Ua: NEGATIVE
Nitrite: NEGATIVE
Protein, ur: NEGATIVE mg/dL
Specific Gravity, Urine: 1.018 (ref 1.005–1.030)
pH: 7 (ref 5.0–8.0)

## 2018-09-12 LAB — POCT I-STAT 7, (LYTES, BLD GAS, ICA,H+H)
Acid-Base Excess: 4 mmol/L — ABNORMAL HIGH (ref 0.0–2.0)
Bicarbonate: 26.6 mmol/L (ref 20.0–28.0)
Calcium, Ion: 1.05 mmol/L — ABNORMAL LOW (ref 1.15–1.40)
HCT: 29 % — ABNORMAL LOW (ref 36.0–46.0)
Hemoglobin: 9.9 g/dL — ABNORMAL LOW (ref 12.0–15.0)
O2 Saturation: 100 %
Patient temperature: 98
Potassium: 3.5 mmol/L (ref 3.5–5.1)
Sodium: 151 mmol/L — ABNORMAL HIGH (ref 135–145)
TCO2: 28 mmol/L (ref 22–32)
pCO2 arterial: 33.5 mmHg (ref 32.0–48.0)
pH, Arterial: 7.507 — ABNORMAL HIGH (ref 7.350–7.450)
pO2, Arterial: 301 mmHg — ABNORMAL HIGH (ref 83.0–108.0)

## 2018-09-12 LAB — TRIGLYCERIDES: Triglycerides: 456 mg/dL — ABNORMAL HIGH (ref <150)

## 2018-09-12 LAB — BASIC METABOLIC PANEL
Anion gap: 10 (ref 5–15)
BUN: 17 mg/dL (ref 6–20)
CO2: 23 mmol/L (ref 22–32)
Calcium: 7.3 mg/dL — ABNORMAL LOW (ref 8.9–10.3)
Chloride: 115 mmol/L — ABNORMAL HIGH (ref 98–111)
Creatinine, Ser: 0.61 mg/dL (ref 0.44–1.00)
GFR calc Af Amer: >60 mL/min (ref >60)
GFR calc non Af Amer: >60 mL/min (ref >60)
Glucose, Bld: 189 mg/dL — ABNORMAL HIGH (ref 70–99)
Potassium: 3.4 mmol/L — ABNORMAL LOW (ref 3.5–5.1)
Sodium: 148 mmol/L — ABNORMAL HIGH (ref 135–145)

## 2018-09-12 LAB — GLUCOSE, CAPILLARY
Glucose-Capillary: 201 mg/dL — ABNORMAL HIGH (ref 70–99)
Glucose-Capillary: 138 mg/dL — ABNORMAL HIGH (ref 70–99)
Glucose-Capillary: 178 mg/dL — ABNORMAL HIGH (ref 70–99)

## 2018-09-12 LAB — HEPATIC FUNCTION PANEL
ALT: 139 U/L — ABNORMAL HIGH (ref 0–44)
AST: 110 U/L — ABNORMAL HIGH (ref 15–41)
Albumin: 2.3 g/dL — ABNORMAL LOW (ref 3.5–5.0)
Alkaline Phosphatase: 47 U/L (ref 38–126)
Bilirubin, Direct: 0.4 mg/dL — ABNORMAL HIGH (ref 0.0–0.2)
Indirect Bilirubin: 0.3 mg/dL (ref 0.3–0.9)
Total Bilirubin: 0.7 mg/dL (ref 0.3–1.2)
Total Protein: 4.9 g/dL — ABNORMAL LOW (ref 6.5–8.1)

## 2018-09-12 LAB — PATHOLOGIST SMEAR REVIEW

## 2018-09-12 LAB — URINE CULTURE: Culture: NO GROWTH

## 2018-09-12 LAB — SARS CORONAVIRUS 2 (TAT 6-24 HRS): SARS Coronavirus 2: NEGATIVE

## 2018-09-12 SURGERY — SURGICAL PROCUREMENT, ORGAN
Anesthesia: Choice | Site: Abdomen

## 2018-09-12 MED ORDER — HEPARIN SODIUM (PORCINE) 1000 UNIT/ML IJ SOLN
8000.0000 [IU] | Freq: Once | INTRAMUSCULAR | Status: DC
  Filled 2018-09-12: qty 8

## 2018-09-12 SURGICAL SUPPLY — 85 items
BLADE CLIPPER SURG (BLADE) IMPLANT
BLADE SAW STERNAL (BLADE) ×1 IMPLANT
BLADE SURG 10 STRL SS (BLADE) IMPLANT
CLIP VESOCCLUDE MED 24/CT (CLIP) ×2 IMPLANT
CLIP VESOCCLUDE SM WIDE 24/CT (CLIP) ×2 IMPLANT
CONT SPEC 4OZ CLIKSEAL STRL BL (MISCELLANEOUS) ×2 IMPLANT
COVER BACK TABLE 60X90IN (DRAPES) IMPLANT
COVER MAYO STAND STRL (DRAPES) IMPLANT
COVER SURGICAL LIGHT HANDLE (MISCELLANEOUS) ×2 IMPLANT
COVER WAND RF STERILE (DRAPES) ×2 IMPLANT
DRAPE HALF SHEET 40X57 (DRAPES) IMPLANT
DRAPE SLUSH MACHINE 52X66 (DRAPES) ×3 IMPLANT
DRSG COVADERM 4X10 (GAUZE/BANDAGES/DRESSINGS) ×2 IMPLANT
DRSG COVADERM 4X8 (GAUZE/BANDAGES/DRESSINGS) ×1 IMPLANT
DRSG TELFA 3X8 NADH (GAUZE/BANDAGES/DRESSINGS) ×2 IMPLANT
DURAPREP 26ML APPLICATOR (WOUND CARE) ×2 IMPLANT
ELECT BLADE 6.5 EXT (BLADE) IMPLANT
ELECT REM PT RETURN 9FT ADLT (ELECTROSURGICAL) ×2
ELECTRODE REM PT RTRN 9FT ADLT (ELECTROSURGICAL) ×2 IMPLANT
GAUZE 4X4 16PLY RFD (DISPOSABLE) IMPLANT
GLOVE BIO SURGEON STRL SZ7 (GLOVE) ×4 IMPLANT
GLOVE BIO SURGEON STRL SZ7.5 (GLOVE) IMPLANT
GLOVE BIO SURGEON STRL SZ8 (GLOVE) IMPLANT
GLOVE BIO SURGEON STRL SZ8.5 (GLOVE) IMPLANT
GLOVE BIOGEL PI IND STRL 7.0 (GLOVE) IMPLANT
GLOVE BIOGEL PI IND STRL 7.5 (GLOVE) IMPLANT
GLOVE BIOGEL PI IND STRL 8 (GLOVE) IMPLANT
GLOVE BIOGEL PI IND STRL 8.5 (GLOVE) IMPLANT
GLOVE BIOGEL PI INDICATOR 7.0 (GLOVE) ×2
GLOVE BIOGEL PI INDICATOR 7.5 (GLOVE)
GLOVE BIOGEL PI INDICATOR 8 (GLOVE)
GLOVE BIOGEL PI INDICATOR 8.5 (GLOVE)
GLOVE SURG SS PI 7.0 STRL IVOR (GLOVE) IMPLANT
GLOVE SURG SS PI 7.5 STRL IVOR (GLOVE) IMPLANT
GLOVE SURG SS PI 8.0 STRL IVOR (GLOVE) IMPLANT
GOWN STRL REUS W/ TWL LRG LVL3 (GOWN DISPOSABLE) ×4 IMPLANT
GOWN STRL REUS W/ TWL XL LVL3 (GOWN DISPOSABLE) ×2 IMPLANT
GOWN STRL REUS W/TWL LRG LVL3 (GOWN DISPOSABLE) ×10
GOWN STRL REUS W/TWL XL LVL3 (GOWN DISPOSABLE) ×2
HANDLE SUCTION POOLE (INSTRUMENTS) IMPLANT
KIT POST MORTEM ADULT 36X90 (BAG) ×2 IMPLANT
KIT TURNOVER KIT B (KITS) ×2 IMPLANT
LOOP VESSEL MAXI BLUE (MISCELLANEOUS) ×1 IMPLANT
LOOP VESSEL MINI RED (MISCELLANEOUS) ×1 IMPLANT
MANIFOLD NEPTUNE II (INSTRUMENTS) ×3 IMPLANT
NDL BIOPSY 14X6 SOFT TISS (NEEDLE) IMPLANT
NEEDLE BIOPSY 14X6 SOFT TISS (NEEDLE) IMPLANT
NS IRRIG 1000ML POUR BTL (IV SOLUTION) IMPLANT
PACK AORTA (CUSTOM PROCEDURE TRAY) ×2 IMPLANT
PAD ARMBOARD 7.5X6 YLW CONV (MISCELLANEOUS) ×4 IMPLANT
PAD DRESSING TELFA 3X8 NADH (GAUZE/BANDAGES/DRESSINGS) ×1 IMPLANT
PENCIL BUTTON HOLSTER BLD 10FT (ELECTRODE) ×2 IMPLANT
SOL PREP POV-IOD 4OZ 10% (MISCELLANEOUS) ×4 IMPLANT
SPONGE INTESTINAL PEANUT (DISPOSABLE) ×2 IMPLANT
SPONGE LAP 18X18 RF (DISPOSABLE) ×1 IMPLANT
STAPLER VISISTAT 35W (STAPLE) ×2 IMPLANT
SUCTION POOLE HANDLE (INSTRUMENTS) ×2
SUT BONE WAX W31G (SUTURE) IMPLANT
SUT ETHIBOND 5 LR DA (SUTURE) IMPLANT
SUT ETHILON 1 LR 30 (SUTURE) ×8 IMPLANT
SUT ETHILON 2 LR (SUTURE) IMPLANT
SUT PROLENE 3 0 RB 1 (SUTURE) IMPLANT
SUT PROLENE 3 0 SH 1 (SUTURE) IMPLANT
SUT PROLENE 4 0 RB 1 (SUTURE)
SUT PROLENE 4-0 RB1 .5 CRCL 36 (SUTURE) IMPLANT
SUT PROLENE 5 0 C 1 24 (SUTURE) IMPLANT
SUT PROLENE 6 0 BV (SUTURE) IMPLANT
SUT SILK 0 TIES 10X30 (SUTURE) IMPLANT
SUT SILK 1 SH (SUTURE) IMPLANT
SUT SILK 1 TIES 10X30 (SUTURE) IMPLANT
SUT SILK 2 0 (SUTURE)
SUT SILK 2 0 SH (SUTURE) IMPLANT
SUT SILK 2 0 SH CR/8 (SUTURE) IMPLANT
SUT SILK 2 0 TIES 10X30 (SUTURE) IMPLANT
SUT SILK 2-0 18XBRD TIE 12 (SUTURE) IMPLANT
SUT SILK 3 0 SH CR/8 (SUTURE) IMPLANT
SUT SILK 3 0 TIES 10X30 (SUTURE) IMPLANT
SWAB COLLECTION DEVICE MRSA (MISCELLANEOUS) IMPLANT
SWAB CULTURE ESWAB REG 1ML (MISCELLANEOUS) IMPLANT
SYR 50ML LL SCALE MARK (SYRINGE) IMPLANT
SYRINGE TOOMEY DISP (SYRINGE) ×1 IMPLANT
TAPE UMBILICAL 1/8 X36 TWILL (MISCELLANEOUS) IMPLANT
TUBE CONNECTING 12X1/4 (SUCTIONS) ×3 IMPLANT
WATER STERILE IRR 1000ML POUR (IV SOLUTION) IMPLANT
YANKAUER SUCT BULB TIP NO VENT (SUCTIONS) ×3 IMPLANT

## 2018-09-13 LAB — CULTURE, BLOOD (ROUTINE X 2)
Culture: NO GROWTH
Culture: NO GROWTH
Special Requests: ADEQUATE
Special Requests: ADEQUATE

## 2018-09-14 LAB — TYPE AND SCREEN
ABO/RH(D): A POS
Antibody Screen: NEGATIVE
PT AG Type: NEGATIVE
Unit division: 0
Unit division: 0

## 2018-09-14 LAB — BPAM RBC
Blood Product Expiration Date: 202009262359
Blood Product Expiration Date: 202010072359
Unit Type and Rh: 6200
Unit Type and Rh: 6200

## 2018-09-15 ENCOUNTER — Encounter (HOSPITAL_COMMUNITY): Payer: Self-pay

## 2018-09-16 LAB — CULTURE, BLOOD (ROUTINE X 2)
Culture: NO GROWTH
Culture: NO GROWTH
Special Requests: ADEQUATE
Special Requests: ADEQUATE

## 2018-10-02 DIAGNOSIS — 419620001 Death: Secondary | SNOMED CT | POA: Diagnosis not present

## 2018-10-02 NOTE — Progress Notes (Signed)
NAME:  Joann Johnson, MRN:  [id number], DOB:  10/11/1960, LOS: 5 ADMISSION DATE:  09/24/2018, CONSULTATION DATE: 9/6 REFERRING MD:  Donnald GarrePfeiffer, CHIEF COMPLAINT:  Comatose   Brief History   58 yo female found unresponsive.  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  Required intubation for airway protection with aspiration pneumonia.  Past Medical History  HTN, HLD   Significant Hospital Events   [facility]6 [facility] hydrocephalus s/p ventriculostomy 9/7 Worsening hypoxemia and hypotension 9/9 Coil embolization of basilar tip aneurysm. Consults:  Neurosurgery   Procedures:  Ventriculostomy 9/6 >> ETT 9/6 >> Rt radial A line 9/7 >>  Lt femoral CVL 9/7 >>   Significant Diagnostic Tests:  CT head 9/06 >> large amount b/l SAH extending to 4th ventricle, mild hydrocephalus CT head 9/10>> Decreasing amount of subarachnoid blood status post coiling.  Ventricular system decompressed.  Possible suboccipital infarction Micro Data:  SARS CoV2 9/06 >> negative Blood 9/07 >>   Antimicrobials:  Zosyn 9/6 >>  Vanc 9/6 >> 9/8  Interim history/subjective:  Started on sedation overnight for apparent tachypnea. Otherwise exam is unchanged.  Objective   Blood pressure 132/60, pulse (!) 58, temperature 98.9 F (37.2 C), temperature source Oral, resp. rate 18, height 5\' 6"  (1.676 m), weight 73.2 kg, last menstrual period 01/02/2000, SpO2 100 %.    Vent Mode: PRVC FiO2 (%):  [40 %-100 %] 40 % Set Rate:  [18 bmp-28 bmp] 18 bmp Vt Set:  [470 mL] 470 mL PEEP:  [5 cmH20-10 cmH20] 10 cmH20 Plateau Pressure:  [18 cmH20-22 cmH20] 22 cmH20   Intake/Output Summary (Last 24 hours) at 09/29/2018 0830 Last data filed at 09/11/2018 0800 Gross per 24 hour  Intake 2131.56 ml  Output 3850 ml  Net -1718.44 ml   Filed Weights   23-Apr-2018 0422 09/11/18 0500 09/15/2018 0700  Weight: 72.8 kg 72.8 kg 73.2 kg   Physical Exam:  General: 58 year old female who appears critically ill HEENT:  Interventricular drain is in place but no longer draining.  Cervical collar is in place.  Pupils are both dilated and unreactive.  Right pupil larger than left. Neuro: Does not follow commands, no response to painful stimuli. Corneal still present on the left. Cough + CV: Heart sounds regular regular rate rhythm PULM: Chest is clear with no ventilator asynchrony. Poor respiratory drive off sedation.  GI: soft, bsx4 active  Extremities: warm/dry, negative edema  Skin: no rashes or lesions  Resolved Hospital Problem list   Lactic acidosis, AKI  Assessment & Plan:   Critically ill due to acute hypoxic respiratory requiring mechanical ventilation Continue full ventilatory support. Unlikely to progress further.  Aspiration pneumonia. Complete 5-day course of Unasyn.  [facility] of  [facility].   No change in examination from yesterday. Will not progress to brain death, but no realistic chance to recover beyond vegetative state.  For possible Organ Donation after cardiac death.    Best practice:  Diet: tube feeds DVT prophylaxis: SCDs GI prophylaxis: protonix Mobility: bedrest Code Status: DNR Disposition: ICU Family: will update following CT.   Labs    CMP Latest Ref Rng & Units 09/08/2018 09/27/2018 09/11/2018  Glucose 70 - 99 mg/dL 562(Z189(H) - -  BUN 6 - 20 mg/dL 17 - -  Creatinine 3.080.44 - 1.00 mg/dL 6.570.61 - -  Sodium 846135 - 145 mmol/L 148(H) 151(H) 154(H)  Potassium 3.5 - 5.1 mmol/L 3.4(L) 3.5 3.4(L)  Chloride 98 - 111 mmol/L  115(H) - -  CO2 22 - 32 mmol/L 23 - -  Calcium 8.9 - 10.3 mg/dL 7.3(L) - -  Total Protein 6.5 - 8.1 g/dL - - -  Total Bilirubin 0.3 - 1.2 mg/dL - - -  Alkaline Phos 38 - 126 U/L - - -  AST 15 - 41 U/L - - -  ALT 0 - 44 U/L - - -   CBC Latest Ref Rng & Units September 30, 2018 09/11/2018 09/11/2018  WBC 4.0 - 10.5 K/uL - - -  Hemoglobin 12.0 - 15.0 g/dL 9.9(L) 9.5(L) 10.9(L)  Hematocrit 36.0 - 46.0 % 29.0(L) 28.0(L) 32.0(L)   Platelets 150 - 400 K/uL - - -   ABG    Component Value Date/Time   PHART 7.507 (H) 2018-09-30 0329   PCO2ART 33.5 Sep 30, 2018 0329   PO2ART 301.0 (H) 2018/09/30 0329   HCO3 26.6 30-Sep-2018 0329   TCO2 28 30-Sep-2018 0329   ACIDBASEDEF 1.0 09/11/2018 2345   O2SAT 100.0 Sep 30, 2018 0329   CBG (last 3)  Recent Labs    09/11/18 0812 09/11/18 1614 09/30/18 0746  GLUCAP 148* 138* 201*    CRITICAL CARE Performed by: Kipp Brood   Total critical care time: 40 minutes  Critical care time was exclusive of separately billable procedures and treating other patients.  Critical care was necessary to treat or prevent imminent or life-threatening deterioration.  Critical care was time spent personally by me on the following activities: development of treatment plan with patient and/or surrogate as well as nursing, discussions with consultants, evaluation of patient's response to treatment, examination of patient, obtaining history from patient or surrogate, ordering and performing treatments and interventions, ordering and review of laboratory studies, ordering and review of radiographic studies, pulse oximetry, re-evaluation of patient's condition and participation in multidisciplinary rounds.  Kipp Brood, MD [facility] ICU Physician [place]  Pager: [phone] Mobile: [phone] After hours: [phone].   2018-09-30, 8:30 AM

## 2018-10-02 NOTE — Progress Notes (Signed)
  NEUROSURGERY PROGRESS NOTE   EVD removed in preparation for organ donation.

## 2018-10-02 NOTE — Death Summary Note (Signed)
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 year old female who presents to the hospital after being found on the floor by her husband.  She was emergently intubated in the emergency department.  CT scan demonstrated diffuse basal subarachnoid hemorrhage.  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  External ventriculostomy drain was placed.  After initial night in the hospital, her respiratory status significantly improved.  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  Immediately postoperatively she was neurologically stable, however overnight acutely declined to the point where both pupils were fixed and dilated, she was not breathing over the vent, and had minimal brainstem reflexes.  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  Neurologic exam was consistent with severe brainstem dysfunction with minimal brainstem reflexes.  Patient's prognosis was discussed extensively with her husband.  [place] [facility] did discuss this patient's case with the husband and he elected to proceed with organ donation.    Pertinent Labs and  Studies  Significant Diagnostic Studies Ct Abdomen Pelvis Wo Contrast  Result Date: 09/11/2018 CLINICAL DATA:  Organ donation EXAM: CT CHEST, ABDOMEN AND PELVIS WITHOUT CONTRAST TECHNIQUE: Multidetector CT imaging of the chest, abdomen and pelvis was performed following the standard protocol without IV contrast. COMPARISON:  None. FINDINGS: CT CHEST FINDINGS Cardiovascular: Heart is normal size. Scattered aortic calcifications. Aorta is normal caliber. Punctate coronary artery calcification in the proximal left anterior descending coronary artery. Mediastinum/Nodes: No mediastinal, hilar, or axillary adenopathy. Lungs/Pleura: Small bilateral pleural effusions. Dense consolidation in the lower lobes bilaterally. Airspace disease also noted in the lingula and right upper lobe. Musculoskeletal: Chest wall soft tissues are unremarkable. No acute bony abnormality. CT ABDOMEN PELVIS FINDINGS Hepatobiliary: Liver is 18.2 cm in craniocaudal length, 20.2 cm in transverse diameter, and 13.8 cm in AP diameter. No focal hepatic abnormality. High-density material within the gallbladder could reflect stones, sludge, or vicarious excretion of prior contrast. No biliary ductal dilatation. Pancreas: No focal abnormality or ductal dilatation. Spleen: No focal abnormality.  Normal size. Adrenals/Urinary Tract: No renal or ureteral stones. No hydronephrosis. No renal or adrenal mass. Urinary bladder unremarkable. Right kidney measures 10 cm in craniocaudal length. Left kidney measures 9.3 cm in craniocaudal length. Urinary bladder decompressed with Foley catheter in place. Stomach/Bowel: Small and large bowel are fluid-filled. Stomach decompressed with NG tube in place. Vascular/Lymphatic: Aortic atherosclerosis. No evidence of aneurysm or adenopathy. Reproductive: Uterus and adnexa unremarkable.  No mass. Other: There is stranding noted within the retroperitoneum bilaterally, right greater than left. This could reflect small  retroperitoneal hematomas. Small amount of free fluid in the abdomen and pelvis. No free air. Musculoskeletal: No acute bony abnormality. IMPRESSION: Small bilateral pleural effusion.  Dense consolidation in the lower lobes is bilaterally with less pronounced consolidation in the upper lobes. Findings concerning for edema or pneumonia. Punctate calcification in the left anterior descending coronary artery. Scattered aortic calcifications. Liver dimensions as above.  No focal hepatic abnormality. Stranding in the retroperitoneum bilaterally, right greater than left could reflect small retroperitoneal hematomas. Small amount of free fluid in the abdomen and pelvis. Electronically Signed   By: Charlett Nose M.D.   On: 09/11/2018 23:03   Ct Angio Head W Or Wo Contrast  Result Date: 09/11/2018 CLINICAL DATA:  58 year old female who presented with diffuse subarachnoid hemorrhage on 09/02/2018. Status post endovascular coil embolization of basilar tip aneurysm yesterday. EXAM: CT ANGIOGRAPHY HEAD TECHNIQUE: Multidetector CT imaging of the head was performed using the standard protocol during bolus administration of intravenous contrast. Multiplanar CT image reconstructions and MIPs were obtained to evaluate the vascular anatomy. CONTRAST:  75mL OMNIPAQUE IOHEXOL 350 MG/ML SOLN COMPARISON:  Head CT without contrast 0437 hours today. Head CT 10/01/2018. FINDINGS: Posterior circulation: Poor enhancement in the bilateral distal vertebral arteries and basilar artery. There is a degree of enhancement in the visible upper cervical vertebral arteries (V3 segments series 6, image 24 on the right and image 30 on the left). Large basilar artery tip region coil pack with associated streak artifact. No significant cerebellar artery or PCA enhancement is identified. Anterior circulation: Normal appearing enhancement of the distal cervical ICAs, greater than that in the vertebral artery V3 segments (series 6, image 27). Both ICA  siphons are patent. No left siphon plaque or stenosis. On the right there is no siphon plaque or stenosis identified. No posterior communicating arteries are identified. Streak artifact at the level of the carotid termini which both appear to remain patent. Bilateral ACA A1 segment enhancement is identified, although the left side might be dominant. Both MCA origins are patent. The left MCA M1 segment and bifurcation are patent. Left MCA branches are within normal limits. Right MCA M1 segment is obscured by streak artifact proximally but remains patent. The right MCA bifurcation is patent. Right MCA branches appear patent but mildly irregular compared to those on the left (series 10, image 17). Similar patency but irregularity of the ACA branches. Venous sinuses: Absence of enhancement of the superior sagittal sinus, torcula, transverse and sigmoid sinuses may reflect early contrast phase rather than loss of dural venous sinus patency. Anatomic variants: None. Other findings: Stable right frontal approach EVD. Stable ventricle size and configuration. Slightly increased gas in the right frontal horn compared to 0437 hours today. Bilateral subarachnoid and small volume intraventricular hemorrhage appears stable. Small bilateral subdural hygromas are stable. No midline shift. There does appear to be confluent hypodensity in the superior cerebellum on series 5, image 28. Review of the MIP images confirms the above findings IMPRESSION: 1. Poor enhancement of the bilateral V4 Vertebral Artery segments and the Basilar Artery. 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 This was discussed by telephone with Dr. Lisbeth Renshaw on 09/11/2018 at 09:50. 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 2. Anterior circulation remains patent. A degree of ACA and Right MCA vasospasm is suspected. 3. Otherwise stable CT appearance of the brain  since 04/30 7 hours today. Electronically Signed   By: Odessa Fleming M.D.   On: 09/11/2018 09:54   Ct Head Wo Contrast  Result Date: 09/11/2018 CLINICAL DATA:  Follow-up intracranial hemorrhage EXAM: CT HEAD WITHOUT CONTRAST TECHNIQUE:  Contiguous axial images were obtained from the base of the skull through the vertex without intravenous contrast. COMPARISON:  CT 3 days ago FINDINGS: Brain: Extensive subarachnoid hemorrhage with some redistribution since prior. Clot thickness has decreased, especially in the basal cisterns. Right frontal ventriculostomy since prior with narrow lateral ventricles and new subdural effusions measuring up to 5 mm in thickness. Suspect cytotoxic edema in the bilateral medial occipital lobe and bilateral superior cerebellum. Vascular: Basilar apex aneurysm coiling Skull: Negative Sinuses/Orbits: Negative IMPRESSION: 1. 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 2. Extensive but decreasing subarachnoid hemorrhage. 3. Right frontal ventriculostomy with resolved ventriculomegaly and new subdural effusions. Electronically Signed   By: Marnee SpringJonathon  Watts M.D.   On: 09/11/2018 06:31   Ct Head Wo Contrast  Result Date: 09/06/2018 CLINICAL DATA:  Altered mental status. EXAM: CT HEAD WITHOUT CONTRAST TECHNIQUE: Contiguous axial images were obtained from the base of the skull through the vertex without intravenous contrast. COMPARISON:  Brain MR dated 12/15/2014. FINDINGS: Brain: Large amount of bilateral subarachnoid hemorrhage, most pronounced in the basilar cisterns, extending into the 4th ventricle. Interval mildly enlarged ventricles. Vascular: No hyperdense vessel or unexpected calcification. Skull: Normal. Negative for fracture or focal lesion. Sinuses/Orbits: Tiny amount of fluid in the right maxillary sinus. Unremarkable orbits. Other: Endotracheal and orogastric tubes. IMPRESSION: 1. Large amount of bilateral subarachnoid hemorrhage, most pronounced in the basilar  cisterns, extending into the 4th ventricle. 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 2. Mild hydrocephalus. 3. Minimal acute right maxillary sinusitis. Critical Value/emergent results were called by telephone at the time of interpretation on 09/11/2018 at 8:29 pm to Dr. Arby BarretteMARCY PFEIFFER , who verbally acknowledged these results. Electronically Signed   By: Beckie SaltsSteven  Reid M.D.   On: 09/17/2018 20:31   Ct Chest Wo Contrast  Result Date: 09/11/2018 CLINICAL DATA:  Organ donation EXAM: CT CHEST, ABDOMEN AND PELVIS WITHOUT CONTRAST TECHNIQUE: Multidetector CT imaging of the chest, abdomen and pelvis was performed following the standard protocol without IV contrast. COMPARISON:  None. FINDINGS: CT CHEST FINDINGS Cardiovascular: Heart is normal size. Scattered aortic calcifications. Aorta is normal caliber. Punctate coronary artery calcification in the proximal left anterior descending coronary artery. Mediastinum/Nodes: No mediastinal, hilar, or axillary adenopathy. Lungs/Pleura: Small bilateral pleural effusions. Dense consolidation in the lower lobes bilaterally. Airspace disease also noted in the lingula and right upper lobe. Musculoskeletal: Chest wall soft tissues are unremarkable. No acute bony abnormality. CT ABDOMEN PELVIS FINDINGS Hepatobiliary: Liver is 18.2 cm in craniocaudal length, 20.2 cm in transverse diameter, and 13.8 cm in AP diameter. No focal hepatic abnormality. High-density material within the gallbladder could reflect stones, sludge, or vicarious excretion of prior contrast. No biliary ductal dilatation. Pancreas: No focal abnormality or ductal dilatation. Spleen: No focal abnormality.  Normal size. Adrenals/Urinary Tract: No renal or ureteral stones. No hydronephrosis. No renal or adrenal mass. Urinary bladder unremarkable. Right kidney measures 10 cm in craniocaudal length. Left kidney measures 9.3 cm in craniocaudal length. Urinary bladder  decompressed with Foley catheter in place. Stomach/Bowel: Small and large bowel are fluid-filled. Stomach decompressed with NG tube in place. Vascular/Lymphatic: Aortic atherosclerosis. No evidence of aneurysm or adenopathy. Reproductive: Uterus and adnexa unremarkable.  No mass. Other: There is stranding noted within the retroperitoneum bilaterally, right greater than left. This could reflect small retroperitoneal hematomas. Small amount of free fluid in the abdomen and pelvis. No free air. Musculoskeletal: No acute bony abnormality. IMPRESSION: Small bilateral pleural  effusion. Dense consolidation in the lower lobes is bilaterally with less pronounced consolidation in the upper lobes. Findings concerning for edema or pneumonia. Punctate calcification in the left anterior descending coronary artery. Scattered aortic calcifications. Liver dimensions as above.  No focal hepatic abnormality. Stranding in the retroperitoneum bilaterally, right greater than left could reflect small retroperitoneal hematomas. Small amount of free fluid in the abdomen and pelvis. Electronically Signed   By: Charlett Nose M.D.   On: 09/11/2018 23:03   Ir Transcath/emboliz  Result Date: 09/26/2018 PROCEDURE: DIAGNOSTIC CEREBRAL ANGIOGRAM COIL EMBOLIZATION OF BASILAR APEX ANEURYSM HISTORY: The patient is a 58 year old woman presenting to the hospital after sudden onset of severe headache. Her initial hospital course was complicated by severe respiratory distress requiring significant supplemental oxygen and ventilator support. As her respiratory and neurologic status has stabilized, she is brought for diagnostic cerebral angiogram with possible endovascular treatment of any identified aneurysm. ACCESS: The technical aspects of the procedure as well as its potential risks and benefits were reviewed with the patient's husband. These risks included but were not limited to stroke, intracranial hemorrhage, bleeding, infection, allergic  reaction, damage to organs or vital structures, stroke, non-diagnostic procedure, and the catastrophic outcomes of heart attack, coma, and death. With an understanding of these risks, informed consent was obtained and witnessed. The patient was placed in the supine position on the angiography table and the skin of right groin prepped in the usual sterile fashion. The procedure was performed under general anesthesia. A 5- French sheath was introduced in the right common femoral artery using Seldinger technique. MEDICATIONS: HEPARIN: 0 Units total. CONTRAST:  70mL OMNIPAQUE IOHEXOL 300 MG/ML  SOLNcc, Omnipaque 300 FLUOROSCOPY TIME:  FLUOROSCOPY TIME: See IR records TECHNIQUE: CATHETERS AND WIRES 5-French JB-1 catheter 180 cm 0.035" glidewire 6-French NeuronMax guide sheath 6-French Berenstein Select JB-1 catheter 0.058" CatV guidecatheter 150 cm XT-27 microcatheter Synchro 2 standard microwire Excelsior XT-17 microcatheter COILS USED Target XL 360 standard 10 mm x 40 cm Target XL 360 soft 8 mm x 30 cm x2 Target XL 360 soft 7 mm x 20 cm Target XL 360 soft 5 mm x 15 cm VESSELS CATHETERIZED Right internal carotid Left internal carotid Left vertebral Right vertebral Right common femoral Basilar artery VESSELS STUDIED Right internal carotid, head Left internal carotid, head Left vertebral Right vertebral Basilar artery, head (during embolization) Basilar artery, head (immediate post-embolization) Basilar artery, head (final control) Right common femoral PROCEDURAL NARRATIVE A 5-Fr JB-1 glide catheter was advanced over a 0.035 glidewire into the aortic arch. The above vessels were then sequentially catheterized and cervical / cerebral angiograms taken. After review of images, the catheter was removed without incident. The basilar apex aneurysm was deemed to be amenable to coil embolization. The 5-Fr sheath was then exchanged over the glidewire for an 8-Fr sheath. Under real-time fluoroscopy, the guide sheath was advanced  over the select catheter and glidewire into the descending aorta. The select catheter was then advanced into the left subclavian artery. The guide sheath was then advanced into the proximal left subclavian artery at the origin of the left vertebral artery. The Select catheter was removed and the 058 guide catheter was coaxially introduced over the microcatheter and microwire. The microcatheter was then advanced under roadmap guidance into the midbasilar artery. The guide catheter was then tracked over the microcatheter to its final position in the mid basilar artery. The microcatheter was then removed and the coiling catheter introduced over the microwire. The catheter was then navigated into the  aneurysm lumen over the microwire. The wire was then removed. The above coils were then sequentially deployed with progressive exclusion of the aneurysm. Angiograms were taken during coil deployment to assess patency of the basilar artery and posterior cerebral arteries as well as progressive exclusion of the aneurysm. Cerebral angiography was performed immediately post embolization. The coiling catheter was then removed and the guide catheter withdrawn into the cervical vertebral artery. Final control angiography was then performed from the guide catheter. The guide catheter and guide sheath were then synchronously removed without incident. After application of the closure device, Dyna CT was obtained. FINDINGS: Right internal carotid, head: Injection reveals the presence of a widely patent ICA, M1, and A1 segments and their branches. 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 The parenchymal and venous phases are normal. The venous sinuses are widely patent. Left internal carotid, head: Injection reveals the presence of a widely patent ICA, A1, and M1 segments and their branches. 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 The parenchymal and venous phases are normal. The venous sinuses are widely patent.  Right vertebral: Injection reveals the presence of a widely patent vertebral artery. This leads to a widely patent basilar artery that terminates in bilateral P1. 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 The aneurysm is somewhat ovoid in shape measuring approximately 11.7 x 7.3 x 9.2 mm. The aneurysm appears to have a relatively narrow neck and does not appear to incorporate the origin of the posterior cerebral arteries. The parenchymal and venous phases are normal. The venous sinuses are widely patent. Basilar artery, head (during embolization): Injection reveals the presence of a widely patent basilar artery and patent bilateral posterior cerebral arteries. Coil mass within the aneurysm is stable, without coil prolapse into the basilar artery or filling defect to suggest thrombus. Basilar artery, head (immediate post-embolization): Injection reveals the presence of a widely patent basilar artery terminating in widely patent bilateral posterior cerebral arteries. Coil mass within the aneurysm is stable, without coil prolapse or filling defect to suggest thrombus. The aneurysm appears to be completely occluded, without any aneurysm filling identified. Left vertebral artery, head (final control): Injection reveals the presence of a widely patent vertebral artery, basilar artery, and bilateral posterior cerebral arteries. Coil mass is stable. Capillary phase does not demonstrate any perfusion deficits. Venous sinuses remain widely patent. DynaCT (post-procedure): 3D Dyna CT done after removal of the right femoral sheath and application of the closure device reveals no significant change in the distribution of subarachnoid hemorrhage. Coil mass with artifact is seen in the region of the basilar apex. The right frontal ventriculostomy catheter remains within the right lateral ventricle. There is no significant ventriculomegaly. Right femoral: Normal vessel. No  significant atherosclerotic disease. Arterial sheath in adequate position. DISPOSITION: Upon completion of the study, the femoral sheath was removed and hemostasis obtained using a 7-Fr ExoSeal closure device. Good proximal and distal lower extremity pulses were documented upon achievement of hemostasis. The procedure was well tolerated and no early complications were observed. The patient was transferred to the intensive care unit in stable hemodynamic condition. IMPRESSION: 1. 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 The aneurysm appears to be completely occluded postprocedure. Dyna CT postprocedure does not demonstrate any new subarachnoid hemorrhage, and adequate position of the right frontal ventriculostomy catheter. The preliminary results of this procedure were shared with the patient and the patient's family. Electronically  Signed   By: Lisbeth Renshaw   On: 09-19-2018 16:37   Ir Angiogram Follow Up Study  Result Date: 09/19/18 PROCEDURE: DIAGNOSTIC CEREBRAL ANGIOGRAM COIL EMBOLIZATION OF BASILAR APEX ANEURYSM HISTORY: The patient is a 58 year old woman presenting to the hospital after sudden onset of severe headache. Her initial hospital course was complicated by severe respiratory distress requiring significant supplemental oxygen and ventilator support. As her respiratory and neurologic status has stabilized, she is brought for diagnostic cerebral angiogram with possible endovascular treatment of any identified aneurysm. ACCESS: The technical aspects of the procedure as well as its potential risks and benefits were reviewed with the patient's husband. These risks included but were not limited to stroke, intracranial hemorrhage, bleeding, infection, allergic reaction, damage to organs or vital structures, stroke, non-diagnostic procedure, and the catastrophic outcomes of heart attack, coma, and death. With an  understanding of these risks, informed consent was obtained and witnessed. The patient was placed in the supine position on the angiography table and the skin of right groin prepped in the usual sterile fashion. The procedure was performed under general anesthesia. A 5- French sheath was introduced in the right common femoral artery using Seldinger technique. MEDICATIONS: HEPARIN: 0 Units total. CONTRAST:  70mL OMNIPAQUE IOHEXOL 300 MG/ML  SOLNcc, Omnipaque 300 FLUOROSCOPY TIME:  FLUOROSCOPY TIME: See IR records TECHNIQUE: CATHETERS AND WIRES 5-French JB-1 catheter 180 cm 0.035" glidewire 6-French NeuronMax guide sheath 6-French Berenstein Select JB-1 catheter 0.058" CatV guidecatheter 150 cm XT-27 microcatheter Synchro 2 standard microwire Excelsior XT-17 microcatheter COILS USED Target XL 360 standard 10 mm x 40 cm Target XL 360 soft 8 mm x 30 cm x2 Target XL 360 soft 7 mm x 20 cm Target XL 360 soft 5 mm x 15 cm VESSELS CATHETERIZED Right internal carotid Left internal carotid Left vertebral Right vertebral Right common femoral Basilar artery VESSELS STUDIED Right internal carotid, head Left internal carotid, head Left vertebral Right vertebral Basilar artery, head (during embolization) Basilar artery, head (immediate post-embolization) Basilar artery, head (final control) Right common femoral PROCEDURAL NARRATIVE A 5-Fr JB-1 glide catheter was advanced over a 0.035 glidewire into the aortic arch. The above vessels were then sequentially catheterized and cervical / cerebral angiograms taken. After review of images, the catheter was removed without incident. The basilar apex aneurysm was deemed to be amenable to coil embolization. The 5-Fr sheath was then exchanged over the glidewire for an 8-Fr sheath. Under real-time fluoroscopy, the guide sheath was advanced over the select catheter and glidewire into the descending aorta. The select catheter was then advanced into the left subclavian artery. The guide sheath  was then advanced into the proximal left subclavian artery at the origin of the left vertebral artery. The Select catheter was removed and the 058 guide catheter was coaxially introduced over the microcatheter and microwire. The microcatheter was then advanced under roadmap guidance into the midbasilar artery. The guide catheter was then tracked over the microcatheter to its final position in the mid basilar artery. The microcatheter was then removed and the coiling catheter introduced over the microwire. The catheter was then navigated into the aneurysm lumen over the microwire. The wire was then removed. The above coils were then sequentially deployed with progressive exclusion of the aneurysm. Angiograms were taken during coil deployment to assess patency of the basilar artery and posterior cerebral arteries as well as progressive exclusion of the aneurysm. Cerebral angiography was performed immediately post embolization. The coiling catheter was then removed and the guide catheter withdrawn into  the cervical vertebral artery. Final control angiography was then performed from the guide catheter. The guide catheter and guide sheath were then synchronously removed without incident. After application of the closure device, Dyna CT was obtained. FINDINGS: Right internal carotid, head: Injection reveals the presence of a widely patent ICA, M1, and A1 segments and their branches. 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 The parenchymal and venous phases are normal. The venous sinuses are widely patent. Left internal carotid, head: Injection reveals the presence of a widely patent ICA, A1, and M1 segments and their branches. 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 The parenchymal and venous phases are normal. The venous sinuses are widely patent. Right vertebral: Injection reveals the presence of a widely patent vertebral artery. This leads to a widely patent basilar artery that terminates in  bilateral P1. 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 The aneurysm is somewhat ovoid in shape measuring approximately 11.7 x 7.3 x 9.2 mm. The aneurysm appears to have a relatively narrow neck and does not appear to incorporate the origin of the posterior cerebral arteries. The parenchymal and venous phases are normal. The venous sinuses are widely patent. Basilar artery, head (during embolization): Injection reveals the presence of a widely patent basilar artery and patent bilateral posterior cerebral arteries. Coil mass within the aneurysm is stable, without coil prolapse into the basilar artery or filling defect to suggest thrombus. Basilar artery, head (immediate post-embolization): Injection reveals the presence of a widely patent basilar artery terminating in widely patent bilateral posterior cerebral arteries. Coil mass within the aneurysm is stable, without coil prolapse or filling defect to suggest thrombus. The aneurysm appears to be completely occluded, without any aneurysm filling identified. Left vertebral artery, head (final control): Injection reveals the presence of a widely patent vertebral artery, basilar artery, and bilateral posterior cerebral arteries. Coil mass is stable. Capillary phase does not demonstrate any perfusion deficits. Venous sinuses remain widely patent. DynaCT (post-procedure): 3D Dyna CT done after removal of the right femoral sheath and application of the closure device reveals no significant change in the distribution of subarachnoid hemorrhage. Coil mass with artifact is seen in the region of the basilar apex. The right frontal ventriculostomy catheter remains within the right lateral ventricle. There is no significant ventriculomegaly. Right femoral: Normal vessel. No significant atherosclerotic disease. Arterial sheath in adequate position. DISPOSITION: Upon completion of the study, the femoral sheath was removed  and hemostasis obtained using a 7-Fr ExoSeal closure device. Good proximal and distal lower extremity pulses were documented upon achievement of hemostasis. The procedure was well tolerated and no early complications were observed. The patient was transferred to the intensive care unit in stable hemodynamic condition. IMPRESSION: 1. 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 The aneurysm appears to be completely occluded postprocedure. Dyna CT postprocedure does not demonstrate any new subarachnoid hemorrhage, and adequate position of the right frontal ventriculostomy catheter. The preliminary results of this procedure were shared with the patient and the patient's family. Electronically Signed   By: Lisbeth Renshaw   On: 09/16/2018 16:37   [facility] Angiogram Follow Up Study  Result Date: 09/24/2018 PROCEDURE: DIAGNOSTIC CEREBRAL ANGIOGRAM COIL EMBOLIZATION OF BASILAR APEX ANEURYSM HISTORY: The patient is a 58 year old woman presenting to the hospital after sudden onset of severe headache. Her initial hospital course was complicated by severe respiratory distress requiring significant supplemental oxygen and ventilator support.  As her respiratory and neurologic status has stabilized, she is brought for diagnostic cerebral angiogram with possible endovascular treatment of any identified aneurysm. ACCESS: The technical aspects of the procedure as well as its potential risks and benefits were reviewed with the patient's husband. These risks included but were not limited to stroke, intracranial hemorrhage, bleeding, infection, allergic reaction, damage to organs or vital structures, stroke, non-diagnostic procedure, and the catastrophic outcomes of heart attack, coma, and death. With an understanding of these risks, informed consent was obtained and witnessed. The patient was placed in the supine position on the angiography table and the  skin of right groin prepped in the usual sterile fashion. The procedure was performed under general anesthesia. A 5- French sheath was introduced in the right common femoral artery using Seldinger technique. MEDICATIONS: HEPARIN: 0 Units total. CONTRAST:  72mL OMNIPAQUE IOHEXOL 300 MG/ML  SOLNcc, Omnipaque 300 FLUOROSCOPY TIME:  FLUOROSCOPY TIME: See IR records TECHNIQUE: CATHETERS AND WIRES 5-French JB-1 catheter 180 cm 0.035" glidewire 6-French NeuronMax guide sheath 6-French Berenstein Select JB-1 catheter 0.058" CatV guidecatheter 150 cm XT-27 microcatheter Synchro 2 standard microwire Excelsior XT-17 microcatheter COILS USED Target XL 360 standard 10 mm x 40 cm Target XL 360 soft 8 mm x 30 cm x2 Target XL 360 soft 7 mm x 20 cm Target XL 360 soft 5 mm x 15 cm VESSELS CATHETERIZED Right internal carotid Left internal carotid Left vertebral Right vertebral Right common femoral Basilar artery VESSELS STUDIED Right internal carotid, head Left internal carotid, head Left vertebral Right vertebral Basilar artery, head (during embolization) Basilar artery, head (immediate post-embolization) Basilar artery, head (final control) Right common femoral PROCEDURAL NARRATIVE A 5-Fr JB-1 glide catheter was advanced over a 0.035 glidewire into the aortic arch. The above vessels were then sequentially catheterized and cervical / cerebral angiograms taken. After review of images, the catheter was removed without incident. The basilar apex aneurysm was deemed to be amenable to coil embolization. The 5-Fr sheath was then exchanged over the glidewire for an 8-Fr sheath. Under real-time fluoroscopy, the guide sheath was advanced over the select catheter and glidewire into the descending aorta. The select catheter was then advanced into the left subclavian artery. The guide sheath was then advanced into the proximal left subclavian artery at the origin of the left vertebral artery. The Select catheter was removed and the 058 guide  catheter was coaxially introduced over the microcatheter and microwire. The microcatheter was then advanced under roadmap guidance into the midbasilar artery. The guide catheter was then tracked over the microcatheter to its final position in the mid basilar artery. The microcatheter was then removed and the coiling catheter introduced over the microwire. The catheter was then navigated into the aneurysm lumen over the microwire. The wire was then removed. The above coils were then sequentially deployed with progressive exclusion of the aneurysm. Angiograms were taken during coil deployment to assess patency of the basilar artery and posterior cerebral arteries as well as progressive exclusion of the aneurysm. Cerebral angiography was performed immediately post embolization. The coiling catheter was then removed and the guide catheter withdrawn into the cervical vertebral artery. Final control angiography was then performed from the guide catheter. The guide catheter and guide sheath were then synchronously removed without incident. After application of the closure device, Dyna CT was obtained. FINDINGS: Right internal carotid, head: Injection reveals the presence of a widely patent ICA, M1, and A1 segments and their branches. 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 The parenchymal  and venous phases are normal. The venous sinuses are widely patent. Left internal carotid, head: Injection reveals the presence of a widely patent ICA, A1, and M1 segments and their branches. 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 The parenchymal and venous phases are normal. The venous sinuses are widely patent. Right vertebral: Injection reveals the presence of a widely patent vertebral artery. This leads to a widely patent basilar artery that terminates in bilateral P1. 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 The aneurysm is somewhat ovoid in shape  measuring approximately 11.7 x 7.3 x 9.2 mm. The aneurysm appears to have a relatively narrow neck and does not appear to incorporate the origin of the posterior cerebral arteries. The parenchymal and venous phases are normal. The venous sinuses are widely patent. Basilar artery, head (during embolization): Injection reveals the presence of a widely patent basilar artery and patent bilateral posterior cerebral arteries. Coil mass within the aneurysm is stable, without coil prolapse into the basilar artery or filling defect to suggest thrombus. Basilar artery, head (immediate post-embolization): Injection reveals the presence of a widely patent basilar artery terminating in widely patent bilateral posterior cerebral arteries. Coil mass within the aneurysm is stable, without coil prolapse or filling defect to suggest thrombus. The aneurysm appears to be completely occluded, without any aneurysm filling identified. Left vertebral artery, head (final control): Injection reveals the presence of a widely patent vertebral artery, basilar artery, and bilateral posterior cerebral arteries. Coil mass is stable. Capillary phase does not demonstrate any perfusion deficits. Venous sinuses remain widely patent. DynaCT (post-procedure): 3D Dyna CT done after removal of the right femoral sheath and application of the closure device reveals no significant change in the distribution of subarachnoid hemorrhage. Coil mass with artifact is seen in the region of the basilar apex. The right frontal ventriculostomy catheter remains within the right lateral ventricle. There is no significant ventriculomegaly. Right femoral: Normal vessel. No significant atherosclerotic disease. Arterial sheath in adequate position. DISPOSITION: Upon completion of the study, the femoral sheath was removed and hemostasis obtained using a 7-Fr ExoSeal closure device. Good proximal and distal lower extremity pulses were documented upon achievement of  hemostasis. The procedure was well tolerated and no early complications were observed. The patient was transferred to the intensive care unit in stable hemodynamic condition. IMPRESSION: 1. 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 The aneurysm appears to be completely occluded postprocedure. Dyna CT postprocedure does not demonstrate any new subarachnoid hemorrhage, and adequate position of the right frontal ventriculostomy catheter. The preliminary results of this procedure were shared with the patient and the patient's family. Electronically Signed   By: Lisbeth Renshaw   On: 09/28/2018 16:37   [facility] Angiogram Follow Up Study  Result Date: 09/03/2018 PROCEDURE: DIAGNOSTIC CEREBRAL ANGIOGRAM COIL EMBOLIZATION OF BASILAR APEX ANEURYSM HISTORY: The patient is a 58 year old woman presenting to the hospital after sudden onset of severe headache. Her initial hospital course was complicated by severe respiratory distress requiring significant supplemental oxygen and ventilator support. As her respiratory and neurologic status has stabilized, she is brought for diagnostic cerebral angiogram with possible endovascular treatment of any identified aneurysm. ACCESS: The technical aspects of the procedure as well as its potential risks and benefits were reviewed with the patient's husband. These risks included but were not limited to stroke, intracranial hemorrhage, bleeding, infection, allergic reaction, damage to organs or vital structures, stroke, non-diagnostic  procedure, and the catastrophic outcomes of heart attack, coma, and death. With an understanding of these risks, informed consent was obtained and witnessed. The patient was placed in the supine position on the angiography table and the skin of right groin prepped in the usual sterile fashion. The procedure was performed under general anesthesia. A 5- French sheath was introduced  in the right common femoral artery using Seldinger technique. MEDICATIONS: HEPARIN: 0 Units total. CONTRAST:  70mL OMNIPAQUE IOHEXOL 300 MG/ML  SOLNcc, Omnipaque 300 FLUOROSCOPY TIME:  FLUOROSCOPY TIME: See IR records TECHNIQUE: CATHETERS AND WIRES 5-French JB-1 catheter 180 cm 0.035" glidewire 6-French NeuronMax guide sheath 6-French Berenstein Select JB-1 catheter 0.058" CatV guidecatheter 150 cm XT-27 microcatheter Synchro 2 standard microwire Excelsior XT-17 microcatheter COILS USED Target XL 360 standard 10 mm x 40 cm Target XL 360 soft 8 mm x 30 cm x2 Target XL 360 soft 7 mm x 20 cm Target XL 360 soft 5 mm x 15 cm VESSELS CATHETERIZED Right internal carotid Left internal carotid Left vertebral Right vertebral Right common femoral Basilar artery VESSELS STUDIED Right internal carotid, head Left internal carotid, head Left vertebral Right vertebral Basilar artery, head (during embolization) Basilar artery, head (immediate post-embolization) Basilar artery, head (final control) Right common femoral PROCEDURAL NARRATIVE A 5-Fr JB-1 glide catheter was advanced over a 0.035 glidewire into the aortic arch. The above vessels were then sequentially catheterized and cervical / cerebral angiograms taken. After review of images, the catheter was removed without incident. The basilar apex aneurysm was deemed to be amenable to coil embolization. The 5-Fr sheath was then exchanged over the glidewire for an 8-Fr sheath. Under real-time fluoroscopy, the guide sheath was advanced over the select catheter and glidewire into the descending aorta. The select catheter was then advanced into the left subclavian artery. The guide sheath was then advanced into the proximal left subclavian artery at the origin of the left vertebral artery. The Select catheter was removed and the 058 guide catheter was coaxially introduced over the microcatheter and microwire. The microcatheter was then advanced under roadmap guidance into the  midbasilar artery. The guide catheter was then tracked over the microcatheter to its final position in the mid basilar artery. The microcatheter was then removed and the coiling catheter introduced over the microwire. The catheter was then navigated into the aneurysm lumen over the microwire. The wire was then removed. The above coils were then sequentially deployed with progressive exclusion of the aneurysm. Angiograms were taken during coil deployment to assess patency of the basilar artery and posterior cerebral arteries as well as progressive exclusion of the aneurysm. Cerebral angiography was performed immediately post embolization. The coiling catheter was then removed and the guide catheter withdrawn into the cervical vertebral artery. Final control angiography was then performed from the guide catheter. The guide catheter and guide sheath were then synchronously removed without incident. After application of the closure device, Dyna CT was obtained. FINDINGS: Right internal carotid, head: Injection reveals the presence of a widely patent ICA, M1, and A1 segments and their branches. 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 The parenchymal and venous phases are normal. The venous sinuses are widely patent. Left internal carotid, head: Injection reveals the presence of a widely patent ICA, A1, and M1 segments and their branches. 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 The parenchymal and venous phases are normal. The venous sinuses are widely patent. Right vertebral: Injection reveals the presence of a widely patent vertebral artery. This leads to  a widely patent basilar artery that terminates in bilateral P1. 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 The aneurysm is somewhat ovoid in shape measuring approximately 11.7 x 7.3 x 9.2 mm. The aneurysm appears to have a relatively narrow neck and does not appear to incorporate the origin  of the posterior cerebral arteries. The parenchymal and venous phases are normal. The venous sinuses are widely patent. Basilar artery, head (during embolization): Injection reveals the presence of a widely patent basilar artery and patent bilateral posterior cerebral arteries. Coil mass within the aneurysm is stable, without coil prolapse into the basilar artery or filling defect to suggest thrombus. Basilar artery, head (immediate post-embolization): Injection reveals the presence of a widely patent basilar artery terminating in widely patent bilateral posterior cerebral arteries. Coil mass within the aneurysm is stable, without coil prolapse or filling defect to suggest thrombus. The aneurysm appears to be completely occluded, without any aneurysm filling identified. Left vertebral artery, head (final control): Injection reveals the presence of a widely patent vertebral artery, basilar artery, and bilateral posterior cerebral arteries. Coil mass is stable. Capillary phase does not demonstrate any perfusion deficits. Venous sinuses remain widely patent. DynaCT (post-procedure): 3D Dyna CT done after removal of the right femoral sheath and application of the closure device reveals no significant change in the distribution of subarachnoid hemorrhage. Coil mass with artifact is seen in the region of the basilar apex. The right frontal ventriculostomy catheter remains within the right lateral ventricle. There is no significant ventriculomegaly. Right femoral: Normal vessel. No significant atherosclerotic disease. Arterial sheath in adequate position. DISPOSITION: Upon completion of the study, the femoral sheath was removed and hemostasis obtained using a 7-Fr ExoSeal closure device. Good proximal and distal lower extremity pulses were documented upon achievement of hemostasis. The procedure was well tolerated and no early complications were observed. The patient was transferred to the intensive care unit in stable  hemodynamic condition. IMPRESSION: 1. 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 The aneurysm appears to be completely occluded postprocedure. Dyna CT postprocedure does not demonstrate any new subarachnoid hemorrhage, and adequate position of the right frontal ventriculostomy catheter. The preliminary results of this procedure were shared with the patient and the patient's family. Electronically Signed   By: Lisbeth Renshaw   On: 09/28/2018 16:37   [facility] Angiogram Follow Up Study  Result Date: 09/11/2018 PROCEDURE: DIAGNOSTIC CEREBRAL ANGIOGRAM COIL EMBOLIZATION OF BASILAR APEX ANEURYSM HISTORY: The patient is a 58 year old woman presenting to the hospital after sudden onset of severe headache. Her initial hospital course was complicated by severe respiratory distress requiring significant supplemental oxygen and ventilator support. As her respiratory and neurologic status has stabilized, she is brought for diagnostic cerebral angiogram with possible endovascular treatment of any identified aneurysm. ACCESS: The technical aspects of the procedure as well as its potential risks and benefits were reviewed with the patient's husband. These risks included but were not limited to stroke, intracranial hemorrhage, bleeding, infection, allergic reaction, damage to organs or vital structures, stroke, non-diagnostic procedure, and the catastrophic outcomes of heart attack, coma, and death. With an understanding of these risks, informed consent was obtained and witnessed. The patient was placed in the supine position on the angiography table and the skin of right groin prepped in the usual sterile fashion. The procedure was performed under general anesthesia. A 5- French sheath was introduced in the right common femoral artery using  Seldinger technique. MEDICATIONS: HEPARIN: 0 Units total. CONTRAST:  70mL OMNIPAQUE IOHEXOL 300 MG/ML  SOLNcc,  Omnipaque 300 FLUOROSCOPY TIME:  FLUOROSCOPY TIME: See IR records TECHNIQUE: CATHETERS AND WIRES 5-French JB-1 catheter 180 cm 0.035" glidewire 6-French NeuronMax guide sheath 6-French Berenstein Select JB-1 catheter 0.058" CatV guidecatheter 150 cm XT-27 microcatheter Synchro 2 standard microwire Excelsior XT-17 microcatheter COILS USED Target XL 360 standard 10 mm x 40 cm Target XL 360 soft 8 mm x 30 cm x2 Target XL 360 soft 7 mm x 20 cm Target XL 360 soft 5 mm x 15 cm VESSELS CATHETERIZED Right internal carotid Left internal carotid Left vertebral Right vertebral Right common femoral Basilar artery VESSELS STUDIED Right internal carotid, head Left internal carotid, head Left vertebral Right vertebral Basilar artery, head (during embolization) Basilar artery, head (immediate post-embolization) Basilar artery, head (final control) Right common femoral PROCEDURAL NARRATIVE A 5-Fr JB-1 glide catheter was advanced over a 0.035 glidewire into the aortic arch. The above vessels were then sequentially catheterized and cervical / cerebral angiograms taken. After review of images, the catheter was removed without incident. The basilar apex aneurysm was deemed to be amenable to coil embolization. The 5-Fr sheath was then exchanged over the glidewire for an 8-Fr sheath. Under real-time fluoroscopy, the guide sheath was advanced over the select catheter and glidewire into the descending aorta. The select catheter was then advanced into the left subclavian artery. The guide sheath was then advanced into the proximal left subclavian artery at the origin of the left vertebral artery. The Select catheter was removed and the 058 guide catheter was coaxially introduced over the microcatheter and microwire. The microcatheter was then advanced under roadmap guidance into the midbasilar artery. The guide catheter was then tracked over the microcatheter to its final position in the mid basilar artery. The microcatheter was then removed  and the coiling catheter introduced over the microwire. The catheter was then navigated into the aneurysm lumen over the microwire. The wire was then removed. The above coils were then sequentially deployed with progressive exclusion of the aneurysm. Angiograms were taken during coil deployment to assess patency of the basilar artery and posterior cerebral arteries as well as progressive exclusion of the aneurysm. Cerebral angiography was performed immediately post embolization. The coiling catheter was then removed and the guide catheter withdrawn into the cervical vertebral artery. Final control angiography was then performed from the guide catheter. The guide catheter and guide sheath were then synchronously removed without incident. After application of the closure device, Dyna CT was obtained. FINDINGS: Right internal carotid, head: Injection reveals the presence of a widely patent ICA, M1, and A1 segments and their branches. 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 The parenchymal and venous phases are normal. The venous sinuses are widely patent. Left internal carotid, head: Injection reveals the presence of a widely patent ICA, A1, and M1 segments and their branches. 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 The parenchymal and venous phases are normal. The venous sinuses are widely patent. Right vertebral: Injection reveals the presence of a widely patent vertebral artery. This leads to a widely patent basilar artery that terminates in bilateral P1. 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 The aneurysm is somewhat ovoid in shape measuring approximately 11.7 x 7.3 x 9.2 mm. The aneurysm appears to have a relatively narrow neck and does not appear to incorporate the origin of the posterior cerebral arteries. The parenchymal and venous phases  are normal. The venous sinuses are widely patent. Basilar artery, head (during  embolization): Injection reveals the presence of a widely patent basilar artery and patent bilateral posterior cerebral arteries. Coil mass within the aneurysm is stable, without coil prolapse into the basilar artery or filling defect to suggest thrombus. Basilar artery, head (immediate post-embolization): Injection reveals the presence of a widely patent basilar artery terminating in widely patent bilateral posterior cerebral arteries. Coil mass within the aneurysm is stable, without coil prolapse or filling defect to suggest thrombus. The aneurysm appears to be completely occluded, without any aneurysm filling identified. Left vertebral artery, head (final control): Injection reveals the presence of a widely patent vertebral artery, basilar artery, and bilateral posterior cerebral arteries. Coil mass is stable. Capillary phase does not demonstrate any perfusion deficits. Venous sinuses remain widely patent. DynaCT (post-procedure): 3D Dyna CT done after removal of the right femoral sheath and application of the closure device reveals no significant change in the distribution of subarachnoid hemorrhage. Coil mass with artifact is seen in the region of the basilar apex. The right frontal ventriculostomy catheter remains within the right lateral ventricle. There is no significant ventriculomegaly. Right femoral: Normal vessel. No significant atherosclerotic disease. Arterial sheath in adequate position. DISPOSITION: Upon completion of the study, the femoral sheath was removed and hemostasis obtained using a 7-Fr ExoSeal closure device. Good proximal and distal lower extremity pulses were documented upon achievement of hemostasis. The procedure was well tolerated and no early complications were observed. The patient was transferred to the intensive care unit in stable hemodynamic condition. IMPRESSION: 1. 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 The aneurysm appears to be completely occluded postprocedure. Dyna CT postprocedure does not demonstrate any new subarachnoid hemorrhage, and adequate position of the right frontal ventriculostomy catheter. The preliminary results of this procedure were shared with the patient and the patient's family. Electronically Signed   By: Lisbeth Renshaw   On: 10/07/2018 16:37   Ir Angiogram Follow Up Study  Result Date: 10/07/18 PROCEDURE: DIAGNOSTIC CEREBRAL ANGIOGRAM COIL EMBOLIZATION OF BASILAR APEX ANEURYSM HISTORY: The patient is a 58 year old woman presenting to the hospital after sudden onset of severe headache. Her initial hospital course was complicated by severe respiratory distress requiring significant supplemental oxygen and ventilator support. As her respiratory and neurologic status has stabilized, she is brought for diagnostic cerebral angiogram with possible endovascular treatment of any identified aneurysm. ACCESS: The technical aspects of the procedure as well as its potential risks and benefits were reviewed with the patient's husband. These risks included but were not limited to stroke, intracranial hemorrhage, bleeding, infection, allergic reaction, damage to organs or vital structures, stroke, non-diagnostic procedure, and the catastrophic outcomes of heart attack, coma, and death. With an understanding of these risks, informed consent was obtained and witnessed. The patient was placed in the supine position on the angiography table and the skin of right groin prepped in the usual sterile fashion. The procedure was performed under general anesthesia. A 5- French sheath was introduced in the right common femoral artery using Seldinger technique. MEDICATIONS: HEPARIN: 0 Units total. CONTRAST:  70mL OMNIPAQUE IOHEXOL 300 MG/ML  SOLNcc, Omnipaque 300 FLUOROSCOPY TIME:  FLUOROSCOPY TIME: See IR records TECHNIQUE: CATHETERS AND WIRES 5-French JB-1 catheter 180 cm 0.035"  glidewire 6-French NeuronMax guide sheath 6-French Berenstein Select JB-1 catheter 0.058" CatV guidecatheter 150 cm XT-27 microcatheter Synchro 2 standard microwire Excelsior XT-17 microcatheter COILS USED Target XL 360 standard  10 mm x 40 cm Target XL 360 soft 8 mm x 30 cm x2 Target XL 360 soft 7 mm x 20 cm Target XL 360 soft 5 mm x 15 cm VESSELS CATHETERIZED Right internal carotid Left internal carotid Left vertebral Right vertebral Right common femoral Basilar artery VESSELS STUDIED Right internal carotid, head Left internal carotid, head Left vertebral Right vertebral Basilar artery, head (during embolization) Basilar artery, head (immediate post-embolization) Basilar artery, head (final control) Right common femoral PROCEDURAL NARRATIVE A 5-Fr JB-1 glide catheter was advanced over a 0.035 glidewire into the aortic arch. The above vessels were then sequentially catheterized and cervical / cerebral angiograms taken. After review of images, the catheter was removed without incident. The basilar apex aneurysm was deemed to be amenable to coil embolization. The 5-Fr sheath was then exchanged over the glidewire for an 8-Fr sheath. Under real-time fluoroscopy, the guide sheath was advanced over the select catheter and glidewire into the descending aorta. The select catheter was then advanced into the left subclavian artery. The guide sheath was then advanced into the proximal left subclavian artery at the origin of the left vertebral artery. The Select catheter was removed and the 058 guide catheter was coaxially introduced over the microcatheter and microwire. The microcatheter was then advanced under roadmap guidance into the midbasilar artery. The guide catheter was then tracked over the microcatheter to its final position in the mid basilar artery. The microcatheter was then removed and the coiling catheter introduced over the microwire. The catheter was then navigated into the aneurysm lumen over the microwire.  The wire was then removed. The above coils were then sequentially deployed with progressive exclusion of the aneurysm. Angiograms were taken during coil deployment to assess patency of the basilar artery and posterior cerebral arteries as well as progressive exclusion of the aneurysm. Cerebral angiography was performed immediately post embolization. The coiling catheter was then removed and the guide catheter withdrawn into the cervical vertebral artery. Final control angiography was then performed from the guide catheter. The guide catheter and guide sheath were then synchronously removed without incident. After application of the closure device, Dyna CT was obtained. FINDINGS: Right internal carotid, head: Injection reveals the presence of a widely patent ICA, M1, and A1 segments and their branches. 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 The parenchymal and venous phases are normal. The venous sinuses are widely patent. Left internal carotid, head: Injection reveals the presence of a widely patent ICA, A1, and M1 segments and their branches. 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 The parenchymal and venous phases are normal. The venous sinuses are widely patent. Right vertebral: Injection reveals the presence of a widely patent vertebral artery. This leads to a widely patent basilar artery that terminates in bilateral P1. 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 The aneurysm is somewhat ovoid in shape measuring approximately 11.7 x 7.3 x 9.2 mm. The aneurysm appears to have a relatively narrow neck and does not appear to incorporate the origin of the posterior cerebral arteries. The parenchymal and venous phases are normal. The venous sinuses are widely patent. Basilar artery, head (during embolization): Injection reveals the presence of a widely patent basilar artery and patent bilateral posterior cerebral arteries. Coil mass  within the aneurysm is stable, without coil prolapse into the basilar artery or filling defect to suggest thrombus. Basilar artery, head (immediate post-embolization): Injection reveals the presence of a widely patent basilar artery terminating in  widely patent bilateral posterior cerebral arteries. Coil mass within the aneurysm is stable, without coil prolapse or filling defect to suggest thrombus. The aneurysm appears to be completely occluded, without any aneurysm filling identified. Left vertebral artery, head (final control): Injection reveals the presence of a widely patent vertebral artery, basilar artery, and bilateral posterior cerebral arteries. Coil mass is stable. Capillary phase does not demonstrate any perfusion deficits. Venous sinuses remain widely patent. DynaCT (post-procedure): 3D Dyna CT done after removal of the right femoral sheath and application of the closure device reveals no significant change in the distribution of subarachnoid hemorrhage. Coil mass with artifact is seen in the region of the basilar apex. The right frontal ventriculostomy catheter remains within the right lateral ventricle. There is no significant ventriculomegaly. Right femoral: Normal vessel. No significant atherosclerotic disease. Arterial sheath in adequate position. DISPOSITION: Upon completion of the study, the femoral sheath was removed and hemostasis obtained using a 7-Fr ExoSeal closure device. Good proximal and distal lower extremity pulses were documented upon achievement of hemostasis. The procedure was well tolerated and no early complications were observed. The patient was transferred to the intensive care unit in stable hemodynamic condition. IMPRESSION: 1. 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 The aneurysm appears to be completely occluded postprocedure. Dyna CT postprocedure does not demonstrate any new subarachnoid  hemorrhage, and adequate position of the right frontal ventriculostomy catheter. The preliminary results of this procedure were shared with the patient and the patient's family. Electronically Signed   By: Lisbeth Renshaw   On: 09/08/2018 16:37   Dg Chest Port 1 View  Result Date: 09/11/2018 CLINICAL DATA:  Intubated, subarachnoid hemorrhage, respiratory failure EXAM: PORTABLE CHEST 1 VIEW COMPARISON:  Chest radiograph from one day prior. FINDINGS: Endotracheal tube tip is 3.2 cm above the carina. Enteric tube enters stomach with the tip not seen on this image. Right PICC terminates in the lower third of the SVC. Stable cardiomediastinal silhouette with normal heart size. No pneumothorax. Trace bilateral pleural effusions, stable. Hazy opacities in parahilar lower lungs bilaterally, similar. IMPRESSION: 1. Well-positioned support structures. 2. Stable hazy parahilar lower lung opacities, favor mild pulmonary edema. 3. Stable trace bilateral pleural effusions. Electronically Signed   By: Delbert Phenix M.D.   On: 09/06/2018 08:49   Dg Chest Port 1 View  Result Date: 09/11/2018 CLINICAL DATA:  Respiratory failure EXAM: PORTABLE CHEST 1 VIEW COMPARISON:  Two days ago FINDINGS: Endotracheal tube tip is 12 mm above the carina. Right PICC with tip at the SVC. The enteric tube tip at least reaches the stomach. Hazy bilateral chest opacification similar to prior. No pneumothorax. Small bilobed metallic density over the right chest is likely a biopsy clip in the breast. IMPRESSION: 1. Unremarkable hardware positioning. 2. Similar bilateral airspace disease with symmetry favoring edema. Electronically Signed   By: Marnee Spring M.D.   On: 09/11/2018 08:20   Dg Chest Port 1 View  Result Date: 09/09/2018 CLINICAL DATA:  58 year old female with respiratory failure, found down with diffuse subarachnoid hemorrhage. EXAM: PORTABLE CHEST 1 VIEW COMPARISON:  09/08/2018 and earlier. FINDINGS: Portable AP semi upright  view at 0929 hours. ETT tip in good position between the level the clavicles and carina. Enteric tube courses to the left abdomen, tip not included. Normal lung volumes. Normal cardiac size and mediastinal contours. Regressed but not resolved bilateral confluent perihilar opacity since yesterday, also improved compared to Sep 23, 2018. Elsewhere no pulmonary edema. No  pneumothorax, pleural effusion. There is mildly increased retrocardiac opacity on the left which partially obscures the diaphragm. Paucity of bowel gas in the upper abdomen. No acute osseous abnormality identified. IMPRESSION: 1. Satisfactory lines and tubes. 2. Regressed but not resolved confluent bilateral perihilar opacity. Favor resolving aspiration over edema. 3. Suspect increased left lower lobe atelectasis. Electronically Signed   By: Odessa Fleming M.D.   On: 09/09/2018 09:42   Dg Chest Port 1 View  Result Date: 09/08/2018 CLINICAL DATA:  Patient found down with diffuse subarachnoid hemorrhage 20-Sep-2018. EXAM: PORTABLE CHEST 1 VIEW COMPARISON:  Single-view of the chest 2018-09-20. FINDINGS: Bilateral airspace disease predominantly in a perihilar distribution and has markedly worsened since yesterday's study. Heart size is mildly enlarged. No pneumothorax or pleural effusion. IMPRESSION: Marked worsening in extensive bilateral airspace disease could be due to progressive edema or pneumonia. Electronically Signed   By: Drusilla Kanner M.D.   On: 09/08/2018 09:51   Dg Chest Portable 1 View  Result Date: 09-20-2018 CLINICAL DATA:  Altered mental status, post intubation EXAM: PORTABLE CHEST 1 VIEW COMPARISON:  Portable exam 1925 hours compared to 10/10/2007 FINDINGS: Tip of endotracheal tube 6 mm from carina, recommend withdrawal 1-2 cm. Nasogastric tube extends into stomach. Numerous EKG leads project over chest. Normal heart size and mediastinal contours. BILATERAL perihilar infiltrates greater on LEFT, could represent pneumonia or aspiration. No  pleural effusion or pneumothorax. Bones unremarkable. IMPRESSION: Tip of endotracheal tube 6 mm from carina, recommend withdrawal 1-2 cm. BILATERAL pulmonary infiltrates, most severe in the LEFT upper lobe, question aspiration versus pneumonia Findings called to Dr. Donnald Garre on 2018/09/20 at 1938 hrs. Electronically Signed   By: Ulyses Southward M.D.   On: 09/20/18 19:40   Dg Abd Portable 1v  Result Date: 09/29/2018 CLINICAL DATA:  Organ procurement EXAM: PORTABLE ABDOMEN - 1 VIEW COMPARISON:  None. FINDINGS: Expected postoperative changes with free intraperitoneal air visualized within the abdomen. Bowel gas pattern is nonspecific. No unexpected retained foreign body or surgical implements. IMPRESSION: No unexpected retained foreign body These results were called by telephone to the operating room at the time of interpretation on 09/04/2018 at 11:32 pm to provider Durwin Nora RN, who verbally acknowledged these results. Electronically Signed   By: Kreg Shropshire M.D.   On: 09/30/2018 23:32   Vas [place] Transcranial Doppler  Result Date: 09/13/2018  Transcranial Doppler Indications: Subarachnoid hemorrhage. History: CT without HCP or new hemorrhage but suggestive of cerebellar/occipital infarct. CTA confirmed complete occlusion of the basilar artery and the intracranial portions of the vertebral arteries. Note from patient's chart. Limitations: Patient's position - not able to insonate the basilar and vertebral              arteries. Performing Technologist: Marilynne Halsted RDMS, RVT  Examination Guidelines: A complete evaluation includes B-mode imaging, spectral Doppler, color Doppler, and power Doppler as needed of all accessible portions of each vessel. Bilateral testing is considered an integral part of a complete examination. Limited examinations for reoccurring indications may be performed as noted.  +----------+-------------+----------+-----------+-------+ RIGHT TCD Right VM (cm)Depth (cm)PulsatilityComment  +----------+-------------+----------+-----------+-------+ MCA           67.00                                 +----------+-------------+----------+-----------+-------+ ACA          -54.00                                 +----------+-------------+----------+-----------+-------+  Term ICA      45.00                                 +----------+-------------+----------+-----------+-------+ PCA           72.00                           P2    +----------+-------------+----------+-----------+-------+ Opthalmic     39.00                                 +----------+-------------+----------+-----------+-------+ ICA siphon    53.00                                 +----------+-------------+----------+-----------+-------+  +----------+------------+----------+-----------+-------------+ LEFT TCD  Left VM (cm)Depth (cm)Pulsatility   Comment    +----------+------------+----------+-----------+-------------+ MCA          72.00                                       +----------+------------+----------+-----------+-------------+ ACA          -30.00                                      +----------+------------+----------+-----------+-------------+ PCA          24.00                                       +----------+------------+----------+-----------+-------------+ Opthalmic    24.00                                       +----------+------------+----------+-----------+-------------+ ICA siphon   59.00                                       +----------+------------+----------+-----------+-------------+ Vertebral                                  Not insonated +----------+------------+----------+-----------+-------------+  +------------+-------+-------------+             VM cm/s   Comment    +------------+-------+-------------+ Prox Basilar       Not insonated +------------+-------+-------------+ Dist Basilar       Not insonated  +------------+-------+-------------+ Summary:  Normal mean flow velocities in anterior cerebral circulation vessels. absent occipital window limits evaluation of posterior circulation vessels. No evidence of vasospasm noted in identified vessels. *See table(s) above for measurements and observations.  Diagnosing physician: Delia Heady MD Electronically signed by Delia Heady MD on 09/13/2018 at 1:16:34 PM.    Final    [place] Ekg Site Rite  Result Date: 09/09/2018 If Site Rite image not attached, placement could not be confirmed due to current cardiac rhythm.  Ir Angio Intra Extracran Sel Internal Carotid Bilat Mod Sed  Result Date: 09/24/2018 PROCEDURE: DIAGNOSTIC CEREBRAL ANGIOGRAM  COIL EMBOLIZATION OF BASILAR APEX ANEURYSM HISTORY: The patient is a 58 year old woman presenting to the hospital after sudden onset of severe headache. Her initial hospital course was complicated by severe respiratory distress requiring significant supplemental oxygen and ventilator support. As her respiratory and neurologic status has stabilized, she is brought for diagnostic cerebral angiogram with possible endovascular treatment of any identified aneurysm. ACCESS: The technical aspects of the procedure as well as its potential risks and benefits were reviewed with the patient's husband. These risks included but were not limited to stroke, intracranial hemorrhage, bleeding, infection, allergic reaction, damage to organs or vital structures, stroke, non-diagnostic procedure, and the catastrophic outcomes of heart attack, coma, and death. With an understanding of these risks, informed consent was obtained and witnessed. The patient was placed in the supine position on the angiography table and the skin of right groin prepped in the usual sterile fashion. The procedure was performed under general anesthesia. A 5- French sheath was introduced in the right common femoral artery using Seldinger technique. MEDICATIONS: HEPARIN: 0 Units  total. CONTRAST:  70mL OMNIPAQUE IOHEXOL 300 MG/ML  SOLNcc, Omnipaque 300 FLUOROSCOPY TIME:  FLUOROSCOPY TIME: See IR records TECHNIQUE: CATHETERS AND WIRES 5-French JB-1 catheter 180 cm 0.035" glidewire 6-French NeuronMax guide sheath 6-French Berenstein Select JB-1 catheter 0.058" CatV guidecatheter 150 cm XT-27 microcatheter Synchro 2 standard microwire Excelsior XT-17 microcatheter COILS USED Target XL 360 standard 10 mm x 40 cm Target XL 360 soft 8 mm x 30 cm x2 Target XL 360 soft 7 mm x 20 cm Target XL 360 soft 5 mm x 15 cm VESSELS CATHETERIZED Right internal carotid Left internal carotid Left vertebral Right vertebral Right common femoral Basilar artery VESSELS STUDIED Right internal carotid, head Left internal carotid, head Left vertebral Right vertebral Basilar artery, head (during embolization) Basilar artery, head (immediate post-embolization) Basilar artery, head (final control) Right common femoral PROCEDURAL NARRATIVE A 5-Fr JB-1 glide catheter was advanced over a 0.035 glidewire into the aortic arch. The above vessels were then sequentially catheterized and cervical / cerebral angiograms taken. After review of images, the catheter was removed without incident. The basilar apex aneurysm was deemed to be amenable to coil embolization. The 5-Fr sheath was then exchanged over the glidewire for an 8-Fr sheath. Under real-time fluoroscopy, the guide sheath was advanced over the select catheter and glidewire into the descending aorta. The select catheter was then advanced into the left subclavian artery. The guide sheath was then advanced into the proximal left subclavian artery at the origin of the left vertebral artery. The Select catheter was removed and the 058 guide catheter was coaxially introduced over the microcatheter and microwire. The microcatheter was then advanced under roadmap guidance into the midbasilar artery. The guide catheter was then tracked over the microcatheter to its final position  in the mid basilar artery. The microcatheter was then removed and the coiling catheter introduced over the microwire. The catheter was then navigated into the aneurysm lumen over the microwire. The wire was then removed. The above coils were then sequentially deployed with progressive exclusion of the aneurysm. Angiograms were taken during coil deployment to assess patency of the basilar artery and posterior cerebral arteries as well as progressive exclusion of the aneurysm. Cerebral angiography was performed immediately post embolization. The coiling catheter was then removed and the guide catheter withdrawn into the cervical vertebral artery. Final control angiography was then performed from the guide catheter. The guide catheter and guide sheath were then synchronously removed without incident. After  application of the closure device, Dyna CT was obtained. FINDINGS: Right internal carotid, head: Injection reveals the presence of a widely patent ICA, M1, and A1 segments and their branches. 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 The parenchymal and venous phases are normal. The venous sinuses are widely patent. Left internal carotid, head: Injection reveals the presence of a widely patent ICA, A1, and M1 segments and their branches. 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 The parenchymal and venous phases are normal. The venous sinuses are widely patent. Right vertebral: Injection reveals the presence of a widely patent vertebral artery. This leads to a widely patent basilar artery that terminates in bilateral P1. 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 The aneurysm is somewhat ovoid in shape measuring approximately 11.7 x 7.3 x 9.2 mm. The aneurysm appears to have a relatively narrow neck and does not appear to incorporate the origin of the posterior cerebral arteries. The parenchymal and venous phases are normal. The venous  sinuses are widely patent. Basilar artery, head (during embolization): Injection reveals the presence of a widely patent basilar artery and patent bilateral posterior cerebral arteries. Coil mass within the aneurysm is stable, without coil prolapse into the basilar artery or filling defect to suggest thrombus. Basilar artery, head (immediate post-embolization): Injection reveals the presence of a widely patent basilar artery terminating in widely patent bilateral posterior cerebral arteries. Coil mass within the aneurysm is stable, without coil prolapse or filling defect to suggest thrombus. The aneurysm appears to be completely occluded, without any aneurysm filling identified. Left vertebral artery, head (final control): Injection reveals the presence of a widely patent vertebral artery, basilar artery, and bilateral posterior cerebral arteries. Coil mass is stable. Capillary phase does not demonstrate any perfusion deficits. Venous sinuses remain widely patent. DynaCT (post-procedure): 3D Dyna CT done after removal of the right femoral sheath and application of the closure device reveals no significant change in the distribution of subarachnoid hemorrhage. Coil mass with artifact is seen in the region of the basilar apex. The right frontal ventriculostomy catheter remains within the right lateral ventricle. There is no significant ventriculomegaly. Right femoral: Normal vessel. No significant atherosclerotic disease. Arterial sheath in adequate position. DISPOSITION: Upon completion of the study, the femoral sheath was removed and hemostasis obtained using a 7-Fr ExoSeal closure device. Good proximal and distal lower extremity pulses were documented upon achievement of hemostasis. The procedure was well tolerated and no early complications were observed. The patient was transferred to the intensive care unit in stable hemodynamic condition. IMPRESSION: 1. 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 The aneurysm appears to be completely occluded postprocedure. Dyna CT postprocedure does not demonstrate any new subarachnoid hemorrhage, and adequate position of the right frontal ventriculostomy catheter. The preliminary results of this procedure were shared with the patient and the patient's family. Electronically Signed   By: Lisbeth Renshaw   On: 09/08/2018 16:37   Ir Angio Vertebral Sel Vertebral Bilat Mod Sed  Result Date: 09/11/2018 PROCEDURE: DIAGNOSTIC CEREBRAL ANGIOGRAM COIL EMBOLIZATION OF BASILAR APEX ANEURYSM HISTORY: The patient is a 58 year old woman presenting to the hospital after sudden onset of severe headache. Her initial hospital course was complicated by severe respiratory distress requiring significant supplemental oxygen and ventilator support. As her respiratory and neurologic status has stabilized, she is brought for diagnostic cerebral angiogram with possible endovascular treatment of any identified aneurysm. ACCESS:  The technical aspects of the procedure as well as its potential risks and benefits were reviewed with the patient's husband. These risks included but were not limited to stroke, intracranial hemorrhage, bleeding, infection, allergic reaction, damage to organs or vital structures, stroke, non-diagnostic procedure, and the catastrophic outcomes of heart attack, coma, and death. With an understanding of these risks, informed consent was obtained and witnessed. The patient was placed in the supine position on the angiography table and the skin of right groin prepped in the usual sterile fashion. The procedure was performed under general anesthesia. A 5- French sheath was introduced in the right common femoral artery using Seldinger technique. MEDICATIONS: HEPARIN: 0 Units total. CONTRAST:  70mL OMNIPAQUE IOHEXOL 300 MG/ML  SOLNcc, Omnipaque 300 FLUOROSCOPY TIME:  FLUOROSCOPY TIME: See IR records TECHNIQUE:  CATHETERS AND WIRES 5-French JB-1 catheter 180 cm 0.035" glidewire 6-French NeuronMax guide sheath 6-French Berenstein Select JB-1 catheter 0.058" CatV guidecatheter 150 cm XT-27 microcatheter Synchro 2 standard microwire Excelsior XT-17 microcatheter COILS USED Target XL 360 standard 10 mm x 40 cm Target XL 360 soft 8 mm x 30 cm x2 Target XL 360 soft 7 mm x 20 cm Target XL 360 soft 5 mm x 15 cm VESSELS CATHETERIZED Right internal carotid Left internal carotid Left vertebral Right vertebral Right common femoral Basilar artery VESSELS STUDIED Right internal carotid, head Left internal carotid, head Left vertebral Right vertebral Basilar artery, head (during embolization) Basilar artery, head (immediate post-embolization) Basilar artery, head (final control) Right common femoral PROCEDURAL NARRATIVE A 5-Fr JB-1 glide catheter was advanced over a 0.035 glidewire into the aortic arch. The above vessels were then sequentially catheterized and cervical / cerebral angiograms taken. After review of images, the catheter was removed without incident. The basilar apex aneurysm was deemed to be amenable to coil embolization. The 5-Fr sheath was then exchanged over the glidewire for an 8-Fr sheath. Under real-time fluoroscopy, the guide sheath was advanced over the select catheter and glidewire into the descending aorta. The select catheter was then advanced into the left subclavian artery. The guide sheath was then advanced into the proximal left subclavian artery at the origin of the left vertebral artery. The Select catheter was removed and the 058 guide catheter was coaxially introduced over the microcatheter and microwire. The microcatheter was then advanced under roadmap guidance into the midbasilar artery. The guide catheter was then tracked over the microcatheter to its final position in the mid basilar artery. The microcatheter was then removed and the coiling catheter introduced over the microwire. The catheter was  then navigated into the aneurysm lumen over the microwire. The wire was then removed. The above coils were then sequentially deployed with progressive exclusion of the aneurysm. Angiograms were taken during coil deployment to assess patency of the basilar artery and posterior cerebral arteries as well as progressive exclusion of the aneurysm. Cerebral angiography was performed immediately post embolization. The coiling catheter was then removed and the guide catheter withdrawn into the cervical vertebral artery. Final control angiography was then performed from the guide catheter. The guide catheter and guide sheath were then synchronously removed without incident. After application of the closure device, Dyna CT was obtained. FINDINGS: Right internal carotid, head: Injection reveals the presence of a widely patent ICA, M1, and A1 segments and their branches. 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 The parenchymal and venous phases are normal. The venous sinuses are widely patent. Left internal carotid, head: Injection reveals the presence of a widely patent ICA,  A1, and M1 segments and their branches. 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 The parenchymal and venous phases are normal. The venous sinuses are widely patent. Right vertebral: Injection reveals the presence of a widely patent vertebral artery. This leads to a widely patent basilar artery that terminates in bilateral P1. 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 The aneurysm is somewhat ovoid in shape measuring approximately 11.7 x 7.3 x 9.2 mm. The aneurysm appears to have a relatively narrow neck and does not appear to incorporate the origin of the posterior cerebral arteries. The parenchymal and venous phases are normal. The venous sinuses are widely patent. Basilar artery, head (during embolization): Injection reveals the presence of a widely patent basilar artery and  patent bilateral posterior cerebral arteries. Coil mass within the aneurysm is stable, without coil prolapse into the basilar artery or filling defect to suggest thrombus. Basilar artery, head (immediate post-embolization): Injection reveals the presence of a widely patent basilar artery terminating in widely patent bilateral posterior cerebral arteries. Coil mass within the aneurysm is stable, without coil prolapse or filling defect to suggest thrombus. The aneurysm appears to be completely occluded, without any aneurysm filling identified. Left vertebral artery, head (final control): Injection reveals the presence of a widely patent vertebral artery, basilar artery, and bilateral posterior cerebral arteries. Coil mass is stable. Capillary phase does not demonstrate any perfusion deficits. Venous sinuses remain widely patent. DynaCT (post-procedure): 3D Dyna CT done after removal of the right femoral sheath and application of the closure device reveals no significant change in the distribution of subarachnoid hemorrhage. Coil mass with artifact is seen in the region of the basilar apex. The right frontal ventriculostomy catheter remains within the right lateral ventricle. There is no significant ventriculomegaly. Right femoral: Normal vessel. No significant atherosclerotic disease. Arterial sheath in adequate position. DISPOSITION: Upon completion of the study, the femoral sheath was removed and hemostasis obtained using a 7-Fr ExoSeal closure device. Good proximal and distal lower extremity pulses were documented upon achievement of hemostasis. The procedure was well tolerated and no early complications were observed. The patient was transferred to the intensive care unit in stable hemodynamic condition. IMPRESSION: 1. 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 The aneurysm appears to be completely occluded postprocedure. Dyna CT  postprocedure does not demonstrate any new subarachnoid hemorrhage, and adequate position of the right frontal ventriculostomy catheter. The preliminary results of this procedure were shared with the patient and the patient's family. Electronically Signed   By: Lisbeth Renshaw   On: 09/06/2018 16:37   Ir Neuro Each Add'l After Basic Uni Left (ms)  Result Date: 10/01/2018 PROCEDURE: DIAGNOSTIC CEREBRAL ANGIOGRAM COIL EMBOLIZATION OF BASILAR APEX ANEURYSM HISTORY: The patient is a 58 year old woman presenting to the hospital after sudden onset of severe headache. Her initial hospital course was complicated by severe respiratory distress requiring significant supplemental oxygen and ventilator support. As her respiratory and neurologic status has stabilized, she is brought for diagnostic cerebral angiogram with possible endovascular treatment of any identified aneurysm. ACCESS: The technical aspects of the procedure as well as its potential risks and benefits were reviewed with the patient's husband. These risks included but were not limited to stroke, intracranial hemorrhage, bleeding, infection, allergic reaction, damage to organs or vital structures, stroke, non-diagnostic procedure, and the catastrophic outcomes of heart attack, coma, and death. With an understanding of these risks, informed consent was  obtained and witnessed. The patient was placed in the supine position on the angiography table and the skin of right groin prepped in the usual sterile fashion. The procedure was performed under general anesthesia. A 5- French sheath was introduced in the right common femoral artery using Seldinger technique. MEDICATIONS: HEPARIN: 0 Units total. CONTRAST:  70mL OMNIPAQUE IOHEXOL 300 MG/ML  SOLNcc, Omnipaque 300 FLUOROSCOPY TIME:  FLUOROSCOPY TIME: See IR records TECHNIQUE: CATHETERS AND WIRES 5-French JB-1 catheter 180 cm 0.035" glidewire 6-French NeuronMax guide sheath 6-French Berenstein Select JB-1 catheter  0.058" CatV guidecatheter 150 cm XT-27 microcatheter Synchro 2 standard microwire Excelsior XT-17 microcatheter COILS USED Target XL 360 standard 10 mm x 40 cm Target XL 360 soft 8 mm x 30 cm x2 Target XL 360 soft 7 mm x 20 cm Target XL 360 soft 5 mm x 15 cm VESSELS CATHETERIZED Right internal carotid Left internal carotid Left vertebral Right vertebral Right common femoral Basilar artery VESSELS STUDIED Right internal carotid, head Left internal carotid, head Left vertebral Right vertebral Basilar artery, head (during embolization) Basilar artery, head (immediate post-embolization) Basilar artery, head (final control) Right common femoral PROCEDURAL NARRATIVE A 5-Fr JB-1 glide catheter was advanced over a 0.035 glidewire into the aortic arch. The above vessels were then sequentially catheterized and cervical / cerebral angiograms taken. After review of images, the catheter was removed without incident. The basilar apex aneurysm was deemed to be amenable to coil embolization. The 5-Fr sheath was then exchanged over the glidewire for an 8-Fr sheath. Under real-time fluoroscopy, the guide sheath was advanced over the select catheter and glidewire into the descending aorta. The select catheter was then advanced into the left subclavian artery. The guide sheath was then advanced into the proximal left subclavian artery at the origin of the left vertebral artery. The Select catheter was removed and the 058 guide catheter was coaxially introduced over the microcatheter and microwire. The microcatheter was then advanced under roadmap guidance into the midbasilar artery. The guide catheter was then tracked over the microcatheter to its final position in the mid basilar artery. The microcatheter was then removed and the coiling catheter introduced over the microwire. The catheter was then navigated into the aneurysm lumen over the microwire. The wire was then removed. The above coils were then sequentially deployed with  progressive exclusion of the aneurysm. Angiograms were taken during coil deployment to assess patency of the basilar artery and posterior cerebral arteries as well as progressive exclusion of the aneurysm. Cerebral angiography was performed immediately post embolization. The coiling catheter was then removed and the guide catheter withdrawn into the cervical vertebral artery. Final control angiography was then performed from the guide catheter. The guide catheter and guide sheath were then synchronously removed without incident. After application of the closure device, Dyna CT was obtained. FINDINGS: Right internal carotid, head: Injection reveals the presence of a widely patent ICA, M1, and A1 segments and their branches. 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 The parenchymal and venous phases are normal. The venous sinuses are widely patent. Left internal carotid, head: Injection reveals the presence of a widely patent ICA, A1, and M1 segments and their branches. 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 The parenchymal and venous phases are normal. The venous sinuses are widely patent. Right vertebral: Injection reveals the presence of a widely patent vertebral artery. This leads to a widely patent basilar artery that terminates in bilateral P1. There is a relatively large a aneurysm at the basilar  apex, projecting superiorly and posteriorly. The aneurysm is somewhat ovoid in shape measuring approximately 11.7 x 7.3 x 9.2 mm. The aneurysm appears to have a relatively narrow neck and does not appear to incorporate the origin of the posterior cerebral arteries. The parenchymal and venous phases are normal. The venous sinuses are widely patent. Basilar artery, head (during embolization): Injection reveals the presence of a widely patent basilar artery and patent bilateral posterior cerebral arteries. Coil mass within the aneurysm is stable, without coil prolapse into the basilar artery or  filling defect to suggest thrombus. Basilar artery, head (immediate post-embolization): Injection reveals the presence of a widely patent basilar artery terminating in widely patent bilateral posterior cerebral arteries. Coil mass within the aneurysm is stable, without coil prolapse or filling defect to suggest thrombus. The aneurysm appears to be completely occluded, without any aneurysm filling identified. Left vertebral artery, head (final control): Injection reveals the presence of a widely patent vertebral artery, basilar artery, and bilateral posterior cerebral arteries. Coil mass is stable. Capillary phase does not demonstrate any perfusion deficits. Venous sinuses remain widely patent. DynaCT (post-procedure): 3D Dyna CT done after removal of the right femoral sheath and application of the closure device reveals no significant change in the distribution of subarachnoid hemorrhage. Coil mass with artifact is seen in the region of the basilar apex. The right frontal ventriculostomy catheter remains within the right lateral ventricle. There is no significant ventriculomegaly. Right femoral: Normal vessel. No significant atherosclerotic disease. Arterial sheath in adequate position. DISPOSITION: Upon completion of the study, the femoral sheath was removed and hemostasis obtained using a 7-Fr ExoSeal closure device. Good proximal and distal lower extremity pulses were documented upon achievement of hemostasis. The procedure was well tolerated and no early complications were observed. The patient was transferred to the intensive care unit in stable hemodynamic condition. IMPRESSION: 1. 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 The aneurysm appears to be completely occluded postprocedure. Dyna CT postprocedure does not demonstrate any new subarachnoid hemorrhage, and adequate position of the right frontal ventriculostomy catheter. The  preliminary results of this procedure were shared with the patient and the patient's family. Electronically Signed   By: Lisbeth Renshaw   On: 09/16/2018 16:37    Microbiology No results found for this or any previous visit (from the past 240 hour(s)).  Lab Basic Metabolic Panel: No results for input(s): NA, K, CL, CO2, GLUCOSE, BUN, CREATININE, CALCIUM, MG, PHOS in the last 168 hours. Liver Function Tests: No results for input(s): AST, ALT, ALKPHOS, BILITOT, PROT, ALBUMIN in the last 168 hours. No results for input(s): LIPASE, AMYLASE in the last 168 hours. No results for input(s): AMMONIA in the last 168 hours. CBC: No results for input(s): WBC, NEUTROABS, HGB, HCT, MCV, PLT in the last 168 hours. Cardiac Enzymes: No results for input(s): CKTOTAL, CKMB, CKMBINDEX, TROPONINI in the last 168 hours. Sepsis Labs: No results for input(s): PROCALCITON, WBC, LATICACIDVEN in the last 168 hours.  Procedures/Operations  1.  External ventriculostomy drain placement 2.  Diagnostic cerebral angiogram, coiling of basilar apex aneurysm.   Jackelyn Hoehn 09/30/2018, 12:22 PM

## 2018-10-02 NOTE — Progress Notes (Signed)
Pt with PEA cardiac rhythm on monitor, pulseless to palpation and pulseless to auscultation verified by 3 RN's. Pt also apneic. Waited 5 further minutes per CDS protocol. Pt now in asystole, pulseless to palpation, pulseless to auscultation verified by 3 RN's. Pt apneic. Official time of death 2114/04/24.

## 2018-10-02 NOTE — Progress Notes (Signed)
Vent changes made as follows peep 10 rate 18 per MD order

## 2018-10-02 NOTE — OR Nursing (Signed)
Patient entered room at [place] performed at [place] ET-Tube 04/25/03 Time of Death Apr 25, 2114  Abdominal X-Ray performed  Radiologist Dr. Marguerita Merles reported to retained instruments or sponges left in abdomen @ 24-Apr-2328

## 2018-10-02 NOTE — Procedures (Signed)
Extubation Procedure Note  Patient Details:   Name: Joann Johnson DOB: 1960/11/15 MRN: [id number]   Airway Documentation:    Vent end date: 14-Sep-2018 Vent end time: 2005   Evaluation  O2 sats: currently acceptable Complications: No apparent complications Patient did tolerate procedure well. Bilateral Breath Sounds: Diminished, Clear   Pt extubated in OR under CDS protocol.   Irineo Axon [facility] 09-14-2018, 8:31 PM

## 2018-10-02 DEATH — deceased

## 2021-08-13 IMAGING — CT CT ANGIO HEAD
1 of 8 series · 16 of 47 positions shown · IV contrast (Omni 300)
Comparison: Head CT without contrast 8995 hours today.

Head CT 09/07/2018.

CLINICAL DATA: 58-year-old female who presented with diffuse
subarachnoid hemorrhage on 09/07/2018. Status post endovascular coil
embolization of basilar tip aneurysm yesterday.

EXAM:
CT ANGIOGRAPHY HEAD
TECHNIQUE: Multidetector CT imaging of the head was performed using the
standard protocol during bolus administration of intravenous
contrast. Multiplanar CT image reconstructions and MIPs were
obtained to evaluate the vascular anatomy.
CONTRAST:  75mL OMNIPAQUE IOHEXOL 350 MG/ML SOLN

[Series 6: cow thins · axial · 0.40mm/px · z∈[-125,+25]mm · 16 of 425 slices shown]
[im 25/425  brain]
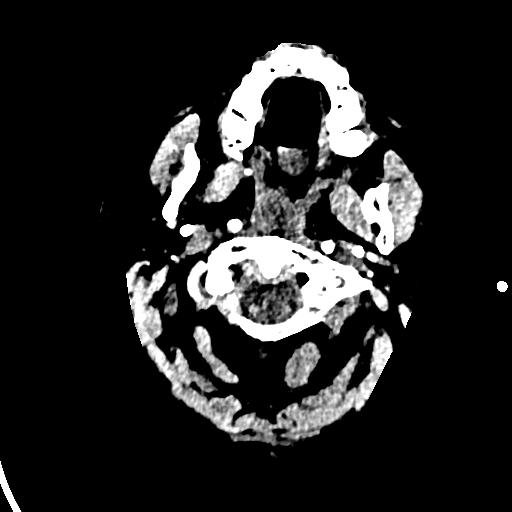
[im 50/425  bone]
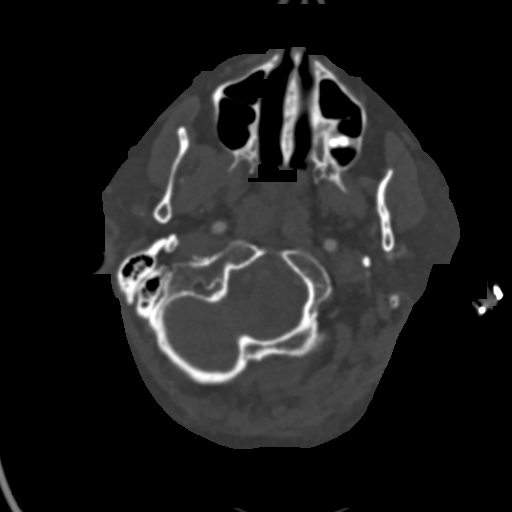
[im 75/425  brain]
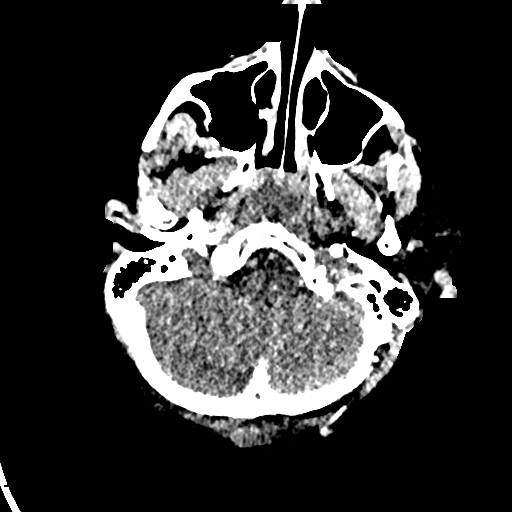
[im 100/425  bone]
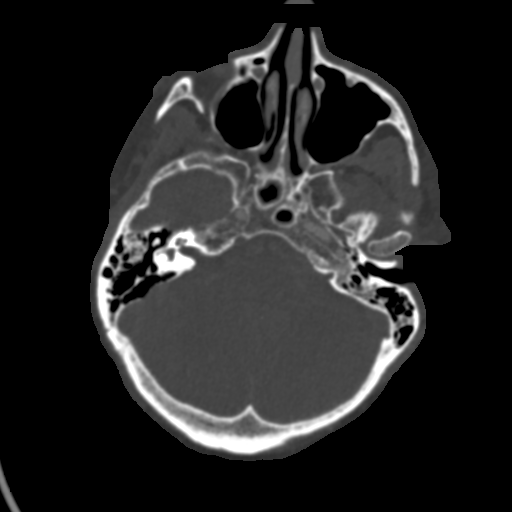
[im 125/425  brain]
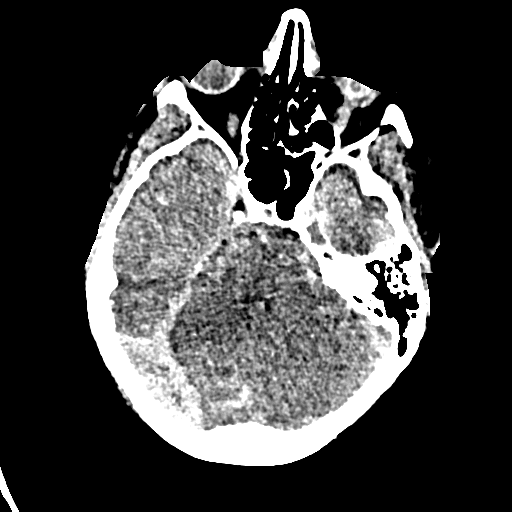
[im 150/425  bone]
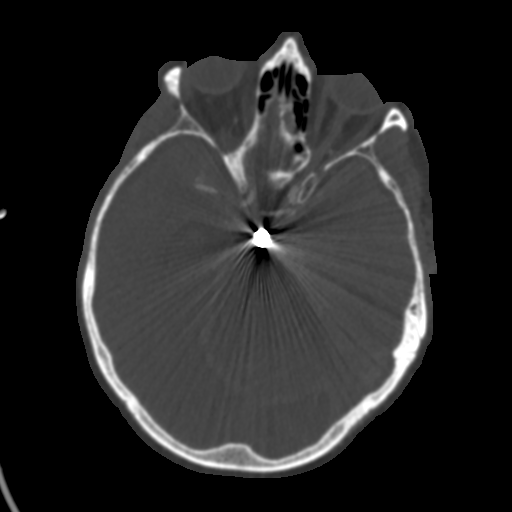
[im 175/425  brain]
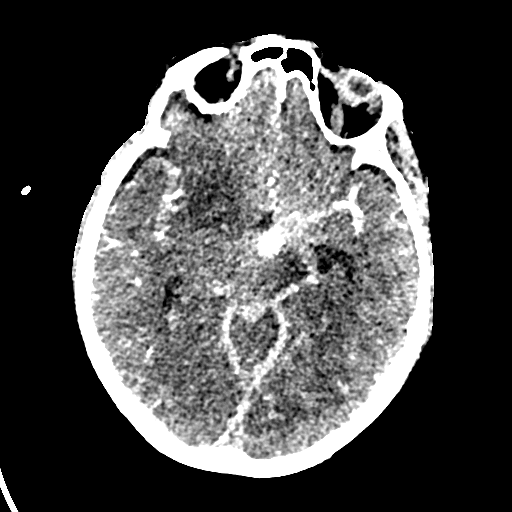
[im 200/425  bone]
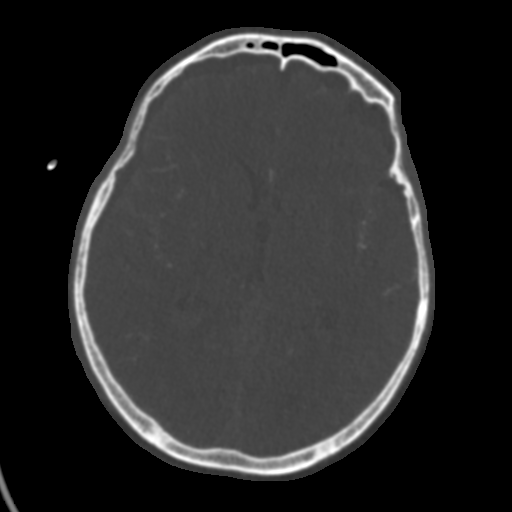
[im 225/425  brain]
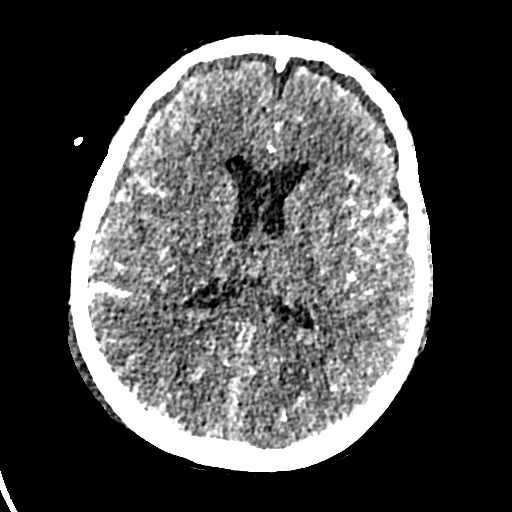
[im 250/425  bone]
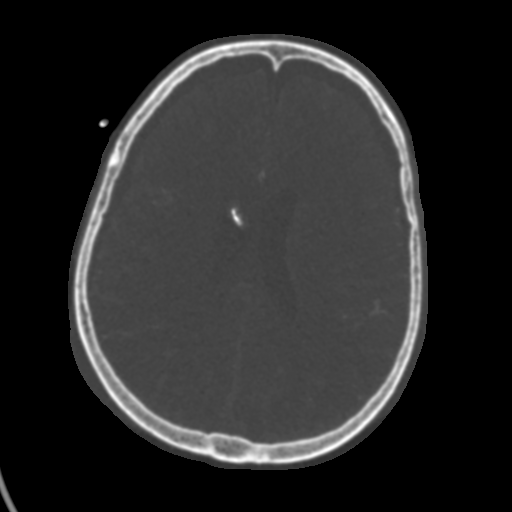
[im 275/425  brain]
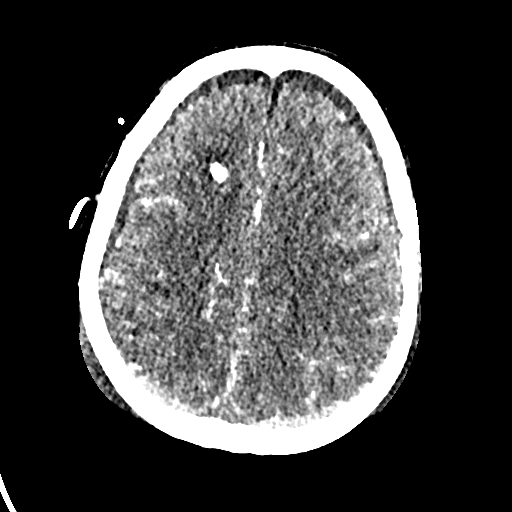
[im 300/425  bone]
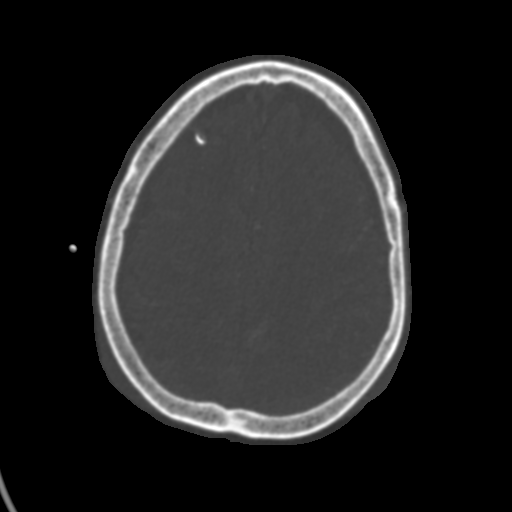
[im 325/425  brain]
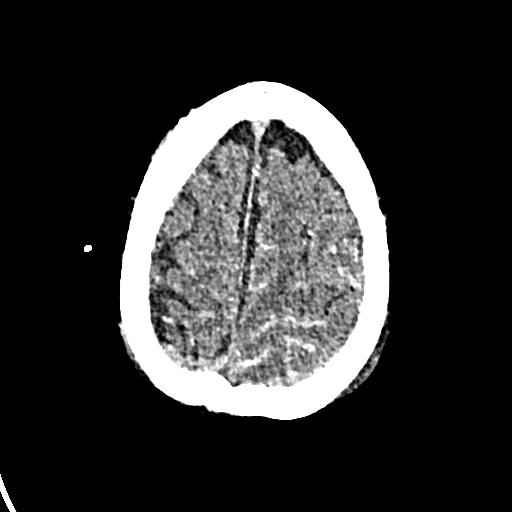
[im 350/425  bone]
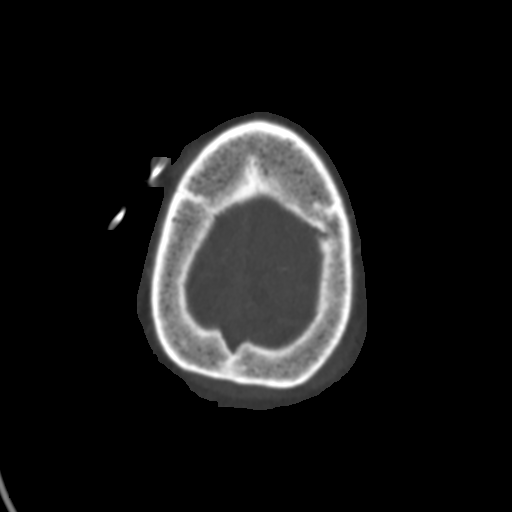
[im 375/425  brain]
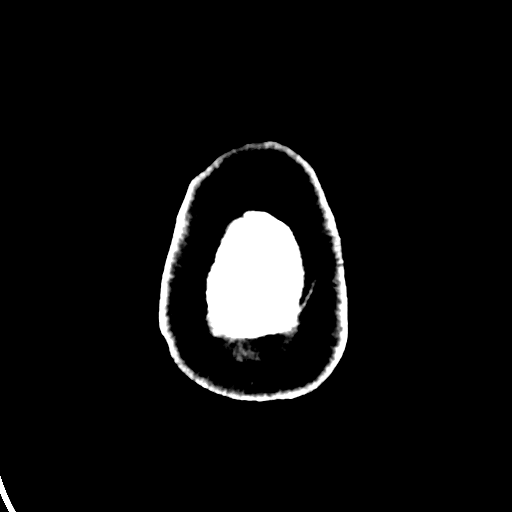
[im 400/425  bone]
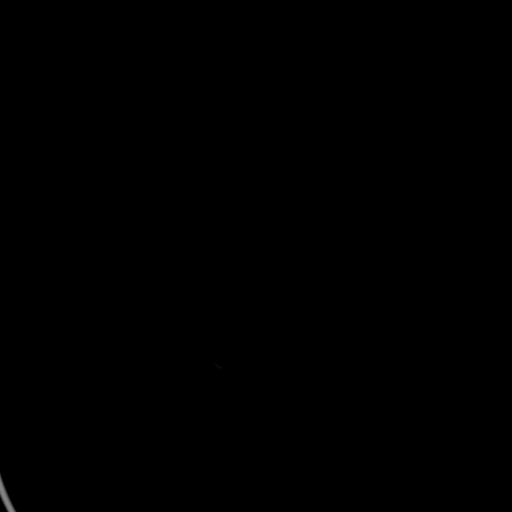

[16 of 47 positions shown; findings below may reference images not displayed]

FINDINGS: Posterior circulation: Poor enhancement in the bilateral distal
vertebral arteries and basilar artery. There is a degree of
enhancement in the visible upper cervical vertebral arteries (V3
segments series 6, image 24 on the right and image 30 on the left).
Large basilar artery tip region coil pack with associated streak
artifact. No significant cerebellar artery or PCA enhancement is
identified.

Anterior circulation: Normal appearing enhancement of the distal
cervical ICAs, greater than that in the vertebral artery V3 segments
(series 6, image 27). Both ICA siphons are patent. No left siphon
plaque or stenosis. On the right there is no siphon plaque or
stenosis identified. No posterior communicating arteries are
identified.

Streak artifact at the level of the carotid termini which both
appear to remain patent. Bilateral ACA A1 segment enhancement is
identified, although the left side might be dominant. Both MCA
origins are patent.

The left MCA M1 segment and bifurcation are patent. Left MCA
branches are within normal limits.

Right MCA M1 segment is obscured by streak artifact proximally but
remains patent. The right MCA bifurcation is patent. Right MCA
branches appear patent but mildly irregular compared to those on the
left (series 10, image 17). Similar patency but irregularity of the
ACA branches.

Venous sinuses: Absence of enhancement of the superior sagittal
sinus, torcula, transverse and sigmoid sinuses may reflect early
contrast phase rather than loss of dural venous sinus patency.

Anatomic variants: None.

Other findings:

Stable right frontal approach EVD. Stable ventricle size and
configuration. Slightly increased gas in the right frontal horn
compared to 8995 hours today. Bilateral subarachnoid and small
volume intraventricular hemorrhage appears stable. Small bilateral
subdural hygromas are stable. No midline shift. There does appear to
be confluent hypodensity in the superior cerebellum on series 5,
image 28.

Review of the MIP images confirms the above findings
IMPRESSION: 1. Poor enhancement of the bilateral V4 Vertebral Artery segments
and the Basilar Artery.
Suspect basilar artery thrombosis, and no cerebellar artery or PCA
enhancement is identified.
This was discussed by telephone with Dr. Giorgi Jumper on
09/11/2018 at [DATE]. And he advises that the clinical picture is
concordant with basilar artery thrombosis.
2. Anterior circulation remains patent. A degree of ACA and Right
MCA vasospasm is suspected.
3. Otherwise stable CT appearance of the brain since [DATE] hours
today.

## 2021-08-13 IMAGING — CT CT HEAD W/O CM
4 series · 16 of 47 positions shown, 18 images · non-contrast
Comparison: CT 3 days ago

CLINICAL DATA: Follow-up intracranial hemorrhage

EXAM:
CT HEAD WITHOUT CONTRAST
TECHNIQUE: Contiguous axial images were obtained from the base of the skull
through the vertex without intravenous contrast.

[Series 3: head without · axial · non-contrast · 0.43mm/px · z∈[-48,+72]mm · 7 of 34 slices shown, 9 images]
[im 5/34  brain]
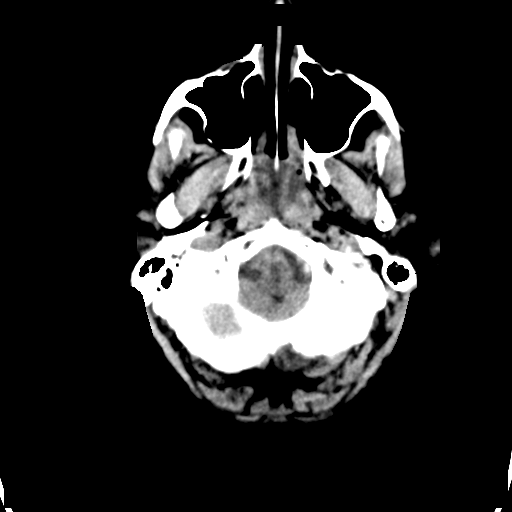
[im 5/34  bone]
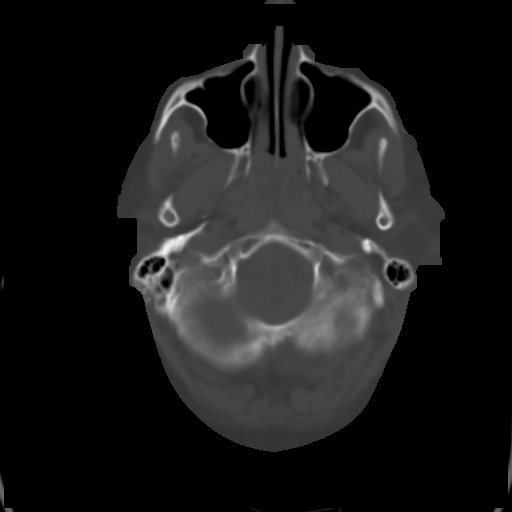
[im 9/34  brain]
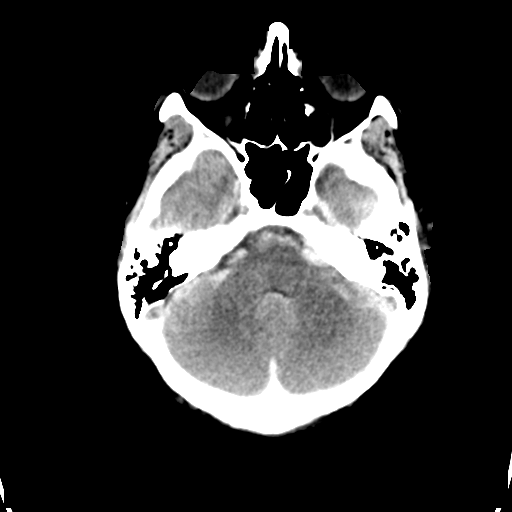
[im 13/34  brain]
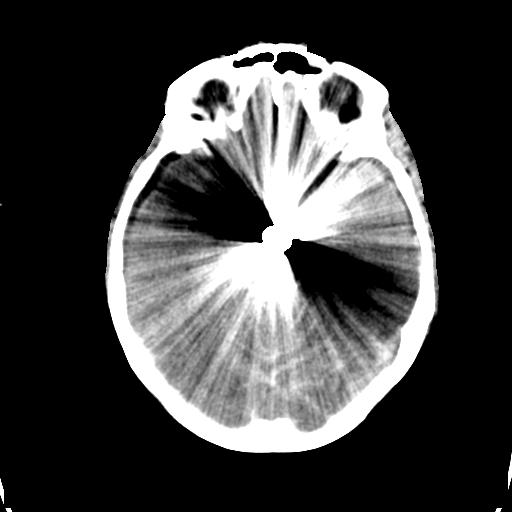
[im 17/34  brain]
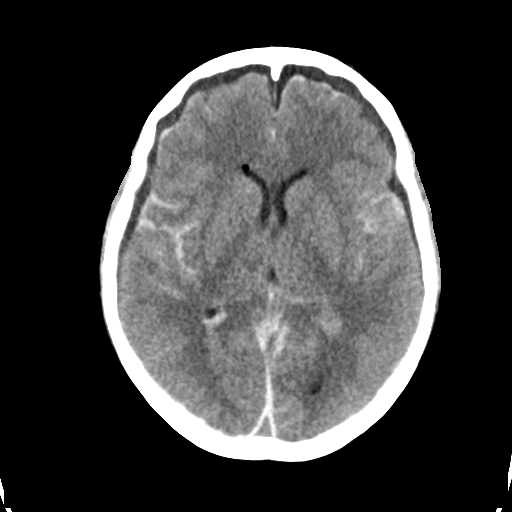
[im 21/34  brain]
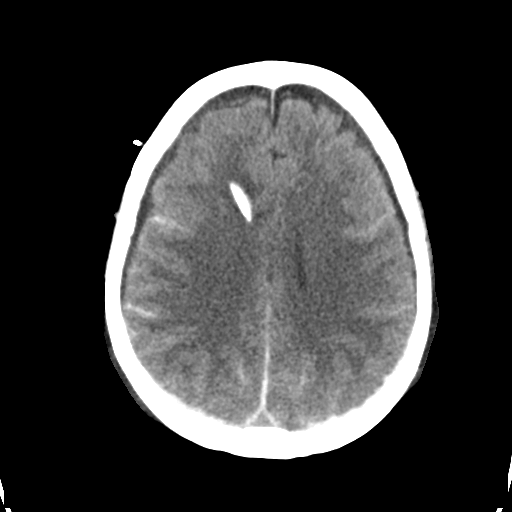
[im 21/34  bone]
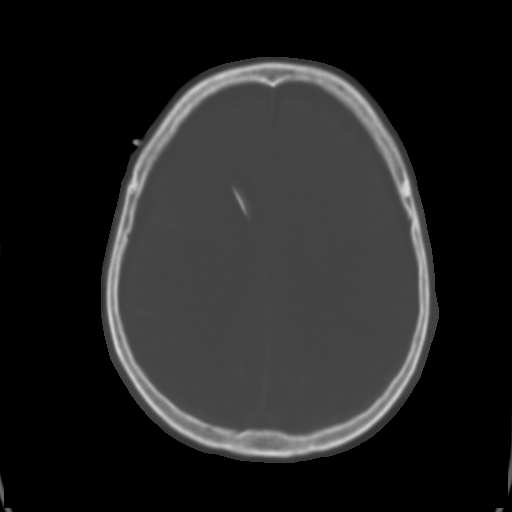
[im 25/34  brain]
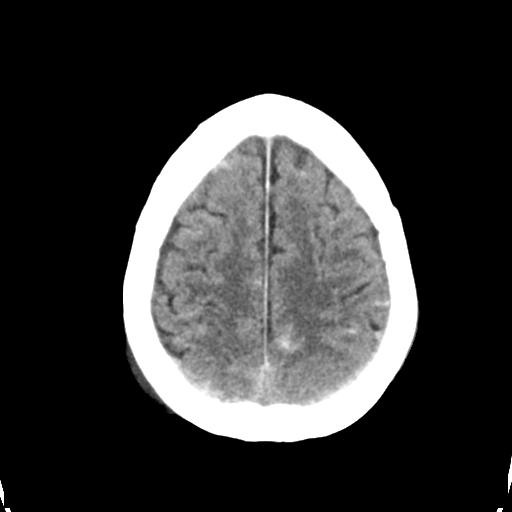
[im 29/34  brain]
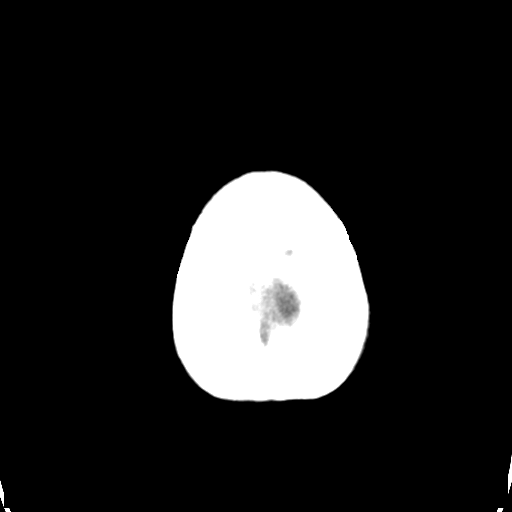

[Series 4: head bone · axial · 0.43mm/px · z∈[-52,-20]mm · 3 of 84 slices shown]
[im 9/84  bone]
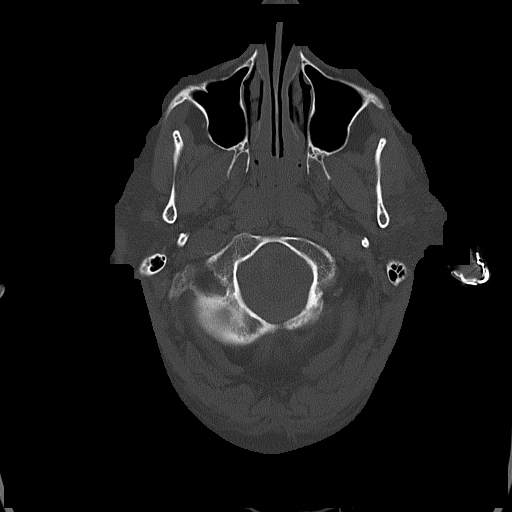
[im 17/84  bone]
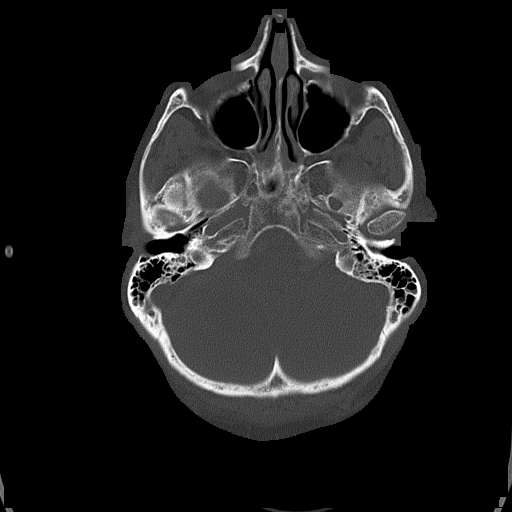
[im 25/84  bone]
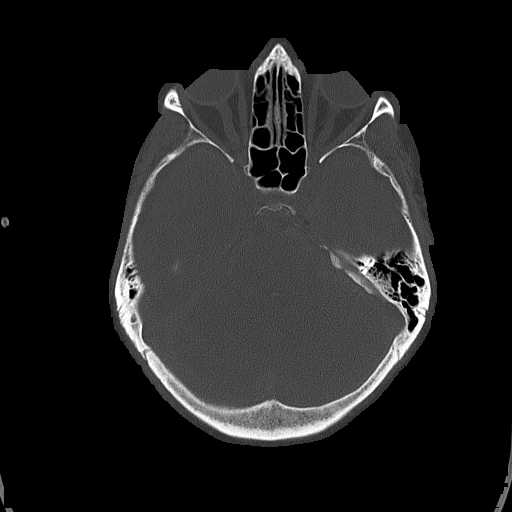

[Series 5: head without cor · coronal · non-contrast · 0.32mm/px · 3 of 70 slices shown]
[im 24/70  brain]
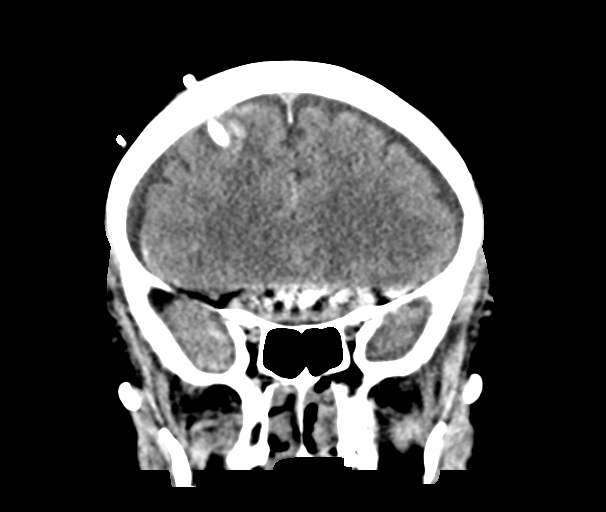
[im 31/70  brain]
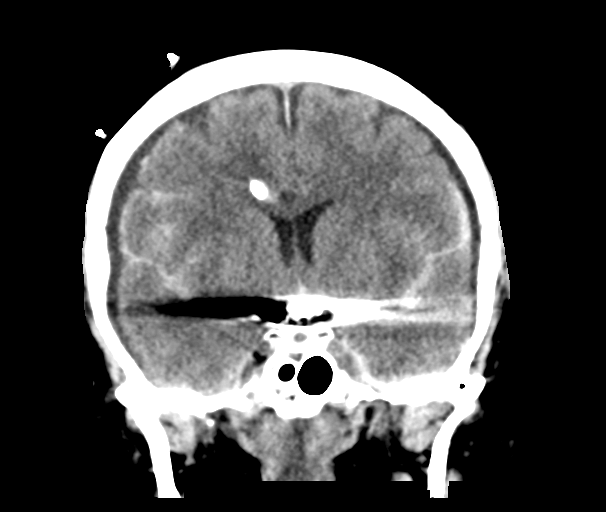
[im 39/70  brain]
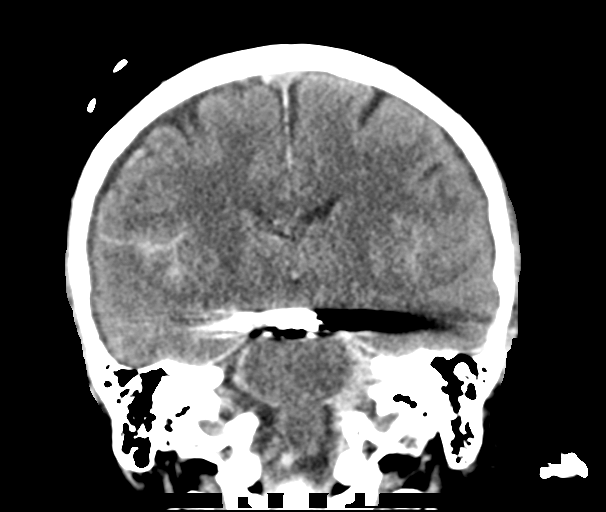

[Series 6: head without sag · sagittal · non-contrast · 0.35mm/px · 3 of 61 slices shown]
[im 21/61  brain]
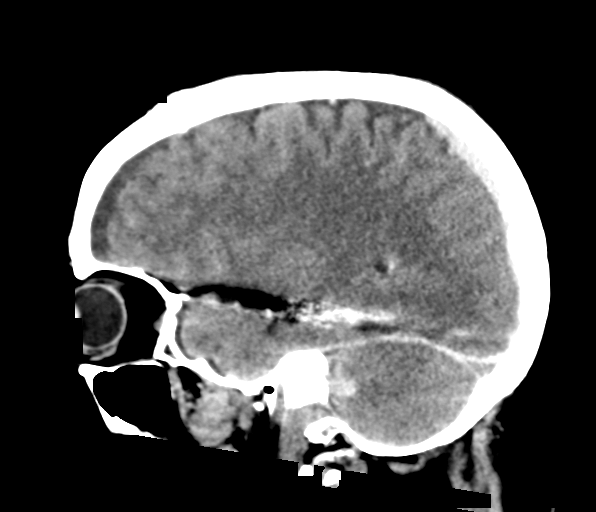
[im 31/61  brain]
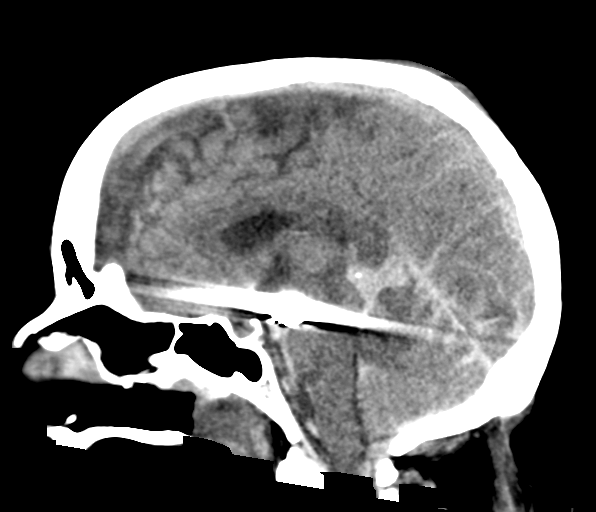
[im 41/61  brain]
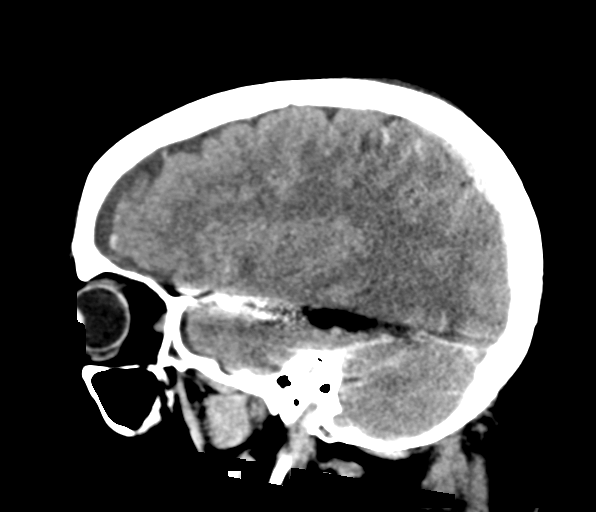

[16 of 47 positions shown; findings below may reference images not displayed]

FINDINGS: Brain: Extensive subarachnoid hemorrhage with some redistribution
since prior. Clot thickness has decreased, especially in the basal
cisterns. Right frontal ventriculostomy since prior with narrow
lateral ventricles and new subdural effusions measuring up to 5 mm
in thickness. Suspect cytotoxic edema in the bilateral medial
occipital lobe and bilateral superior cerebellum.

Vascular: Basilar apex aneurysm coiling

Skull: Negative

Sinuses/Orbits: Negative
IMPRESSION: 1. Suspect bilateral occipital and superior cerebellar infarction.
2. Extensive but decreasing subarachnoid hemorrhage.
3. Right frontal ventriculostomy with resolved ventriculomegaly and
new subdural effusions.

## 2021-08-14 IMAGING — DX DG CHEST 1V PORT
1 series · 1 of 1 positions shown · non-contrast
Comparison: Chest radiograph from one day prior.

CLINICAL DATA: Intubated, subarachnoid hemorrhage, respiratory
failure

EXAM:
PORTABLE CHEST 1 VIEW

[chest ap]
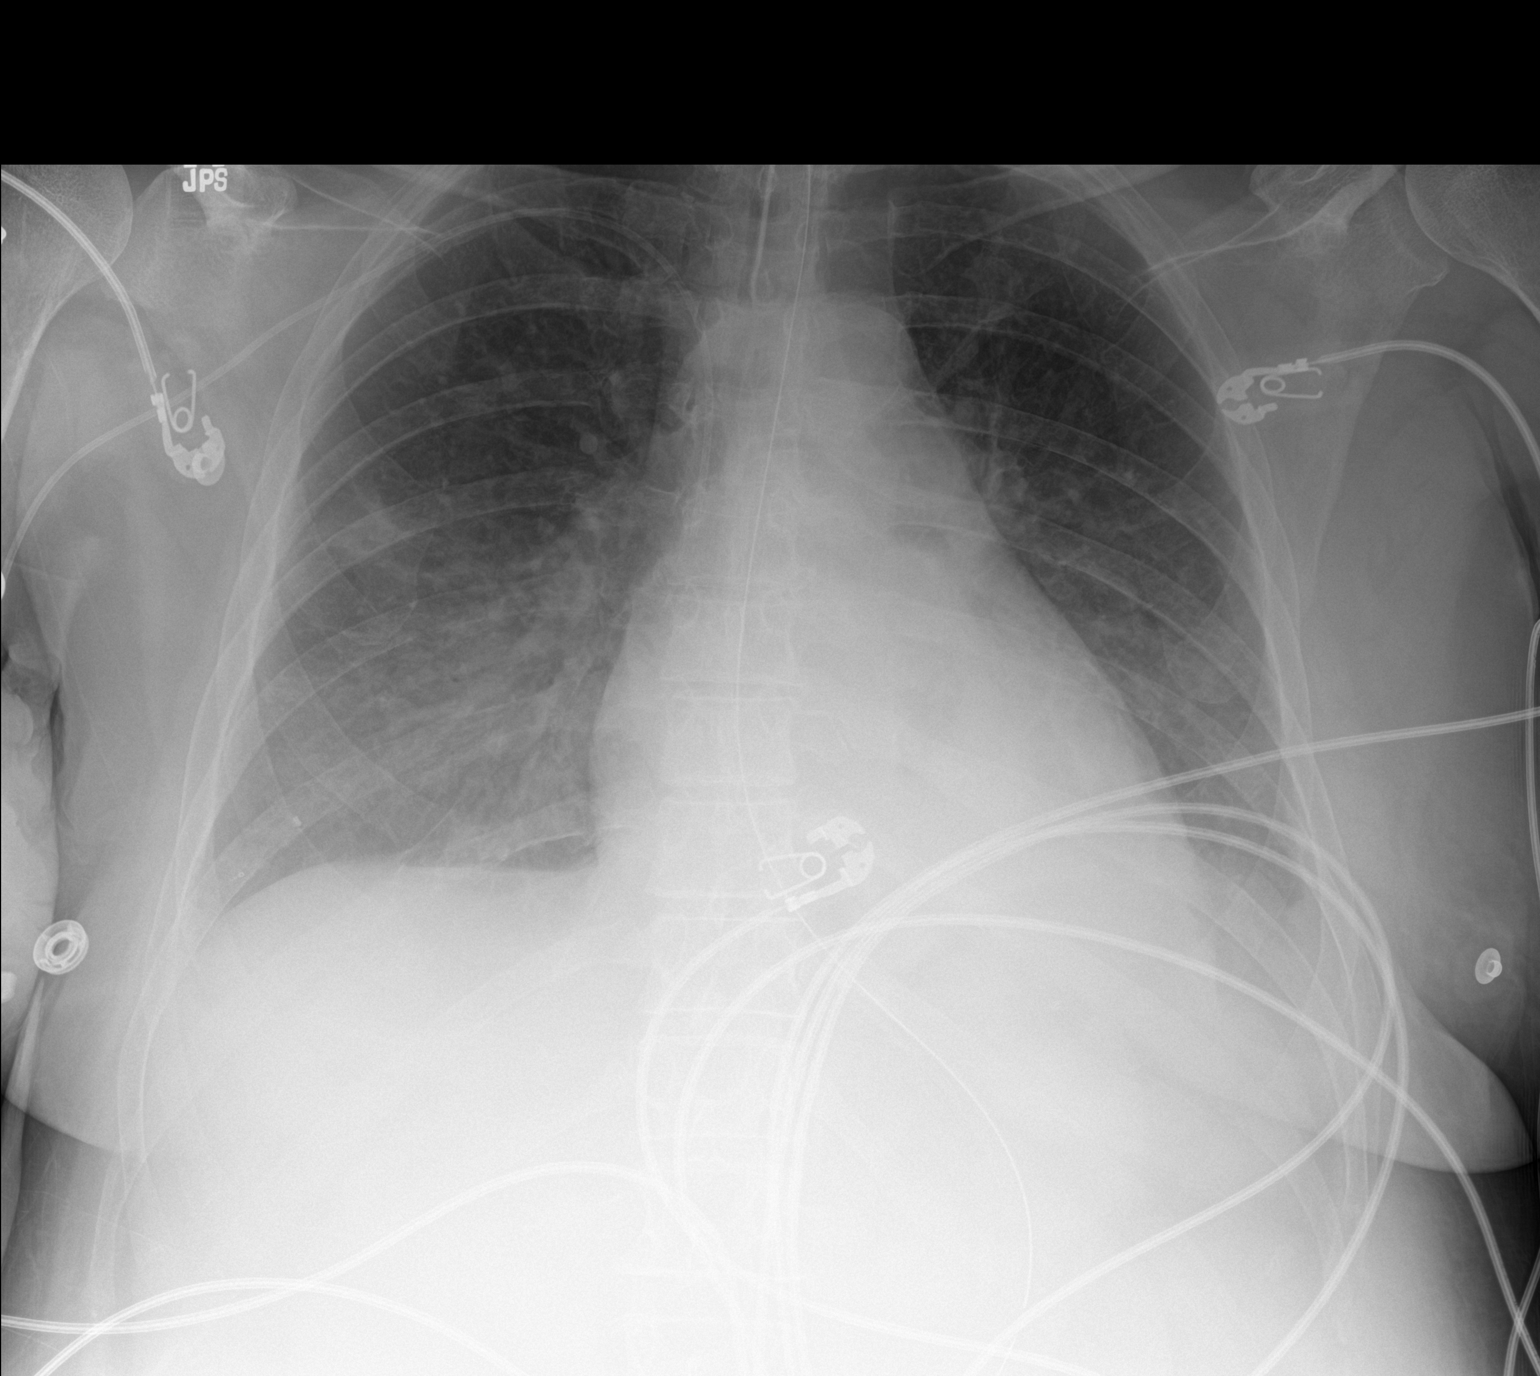

[1 of 1 positions shown; findings below may reference images not displayed]

FINDINGS: Endotracheal tube tip is 3.2 cm above the carina. Enteric tube
enters stomach with the tip not seen on this image. Right PICC
terminates in the lower third of the SVC. Stable cardiomediastinal
silhouette with normal heart size. No pneumothorax. Trace bilateral
pleural effusions, stable. Hazy opacities in parahilar lower lungs
bilaterally, similar.
IMPRESSION: 1. Well-positioned support structures.
2. Stable hazy parahilar lower lung opacities, favor mild pulmonary
edema.
3. Stable trace bilateral pleural effusions.
